# Patient Record
Sex: Female | Born: 1985 | Race: Black or African American | Hispanic: No | Marital: Single | Smoking: Never smoker
Health system: Southern US, Community
[De-identification: ages and names within clinical notes are randomized; demographics above are authoritative.]

## PROBLEM LIST (undated history)

## (undated) DIAGNOSIS — R569 Unspecified convulsions: Secondary | ICD-10-CM

---

## 2010-01-10 ENCOUNTER — Emergency Department (HOSPITAL_BASED_OUTPATIENT_CLINIC_OR_DEPARTMENT_OTHER)
Admission: EM | Admit: 2010-01-10 | Discharge: 2010-01-10 | Payer: Self-pay | Source: Home / Self Care | Admitting: Emergency Medicine

## 2010-01-12 ENCOUNTER — Inpatient Hospital Stay (HOSPITAL_COMMUNITY)
Admission: AD | Admit: 2010-01-12 | Discharge: 2010-01-12 | Payer: Self-pay | Source: Home / Self Care | Attending: Obstetrics & Gynecology | Admitting: Obstetrics & Gynecology

## 2010-01-31 ENCOUNTER — Other Ambulatory Visit: Payer: Self-pay | Admitting: Obstetrics & Gynecology

## 2010-01-31 DIAGNOSIS — N83209 Unspecified ovarian cyst, unspecified side: Secondary | ICD-10-CM

## 2010-03-11 ENCOUNTER — Ambulatory Visit (HOSPITAL_COMMUNITY)
Admission: RE | Admit: 2010-03-11 | Discharge: 2010-03-11 | Disposition: A | Discharge status: Home / Self Care | Payer: 59 | Source: Ambulatory Visit | Attending: Obstetrics & Gynecology | Admitting: Obstetrics & Gynecology

## 2010-03-11 DIAGNOSIS — N83209 Unspecified ovarian cyst, unspecified side: Secondary | ICD-10-CM | POA: Insufficient documentation

## 2010-03-25 LAB — BASIC METABOLIC PANEL
BUN: 22 mg/dL (ref 6–23)
CO2: 26 mEq/L (ref 19–32)
Calcium: 9.4 mg/dL (ref 8.4–10.5)
Chloride: 103 mEq/L (ref 96–112)
Creatinine, Ser: 0.8 mg/dL (ref 0.4–1.2)
GFR calc Af Amer: >60 mL/min (ref >60)
GFR calc non Af Amer: >60 mL/min (ref >60)
Glucose, Bld: 113 mg/dL — ABNORMAL HIGH (ref 70–99)
Potassium: 3.6 mEq/L (ref 3.5–5.1)
Sodium: 143 mEq/L (ref 135–145)

## 2010-03-25 LAB — PREGNANCY, URINE: Preg Test, Ur: NEGATIVE

## 2010-03-25 LAB — URINALYSIS, ROUTINE W REFLEX MICROSCOPIC
Bilirubin Urine: NEGATIVE
Glucose, UA: NEGATIVE mg/dL
Hgb urine dipstick: NEGATIVE
Ketones, ur: 15 mg/dL — AB
Nitrite: NEGATIVE
Protein, ur: NEGATIVE mg/dL
Specific Gravity, Urine: 1.034 — ABNORMAL HIGH (ref 1.005–1.030)
Urobilinogen, UA: 1 mg/dL (ref 0.0–1.0)
pH: 6 (ref 5.0–8.0)

## 2010-03-25 LAB — CBC
HCT: 31.4 % — ABNORMAL LOW (ref 36.0–46.0)
Hemoglobin: 11.4 g/dL — ABNORMAL LOW (ref 12.0–15.0)
MCH: 28.8 pg (ref 26.0–34.0)
MCHC: 36.3 g/dL — ABNORMAL HIGH (ref 30.0–36.0)
MCV: 79.3 fL (ref 78.0–100.0)
Platelets: 235 10*3/uL (ref 150–400)
RBC: 3.96 MIL/uL (ref 3.87–5.11)
RDW: 12.1 % (ref 11.5–15.5)
WBC: 12.4 10*3/uL — ABNORMAL HIGH (ref 4.0–10.5)

## 2010-03-25 LAB — DIFFERENTIAL
Basophils Absolute: 0 10*3/uL (ref 0.0–0.1)
Basophils Relative: 0 % (ref 0–1)
Eosinophils Absolute: 0.6 10*3/uL (ref 0.0–0.7)
Eosinophils Relative: 5 % (ref 0–5)
Lymphocytes Relative: 23 % (ref 12–46)
Lymphs Abs: 2.8 10*3/uL (ref 0.7–4.0)
Monocytes Absolute: 1.4 10*3/uL — ABNORMAL HIGH (ref 0.1–1.0)
Monocytes Relative: 12 % (ref 3–12)
Neutro Abs: 7.5 10*3/uL (ref 1.7–7.7)
Neutrophils Relative %: 61 % (ref 43–77)

## 2010-03-25 LAB — GC/CHLAMYDIA PROBE AMP, GENITAL
Chlamydia, DNA Probe: POSITIVE — AB
GC Probe Amp, Genital: NEGATIVE

## 2010-03-25 LAB — WET PREP, GENITAL
Trich, Wet Prep: NONE SEEN
Yeast Wet Prep HPF POC: NONE SEEN

## 2010-04-05 ENCOUNTER — Inpatient Hospital Stay (HOSPITAL_COMMUNITY)
Admission: AD | Admit: 2010-04-05 | Discharge: 2010-04-05 | Disposition: A | Discharge status: Home / Self Care | Payer: 59 | Source: Ambulatory Visit | Attending: Obstetrics & Gynecology | Admitting: Obstetrics & Gynecology

## 2010-04-05 DIAGNOSIS — N938 Other specified abnormal uterine and vaginal bleeding: Secondary | ICD-10-CM

## 2010-04-05 DIAGNOSIS — N949 Unspecified condition associated with female genital organs and menstrual cycle: Secondary | ICD-10-CM | POA: Insufficient documentation

## 2010-04-05 DIAGNOSIS — N925 Other specified irregular menstruation: Secondary | ICD-10-CM

## 2010-04-05 LAB — URINALYSIS, ROUTINE W REFLEX MICROSCOPIC
Bilirubin Urine: NEGATIVE
Glucose, UA: NEGATIVE mg/dL
Ketones, ur: 15 mg/dL — AB
Leukocytes, UA: NEGATIVE
Nitrite: NEGATIVE
Protein, ur: NEGATIVE mg/dL
Specific Gravity, Urine: 1.025 (ref 1.005–1.030)
Urobilinogen, UA: 0.2 mg/dL (ref 0.0–1.0)
pH: 6.5 (ref 5.0–8.0)

## 2010-04-05 LAB — URINE MICROSCOPIC-ADD ON

## 2010-04-05 LAB — WET PREP, GENITAL
Clue Cells Wet Prep HPF POC: NONE SEEN
Trich, Wet Prep: NONE SEEN
Yeast Wet Prep HPF POC: NONE SEEN

## 2010-04-05 LAB — CBC
HCT: 34.8 % — ABNORMAL LOW (ref 36.0–46.0)
Hemoglobin: 12.4 g/dL (ref 12.0–15.0)
MCH: 28.8 pg (ref 26.0–34.0)
MCHC: 35.6 g/dL (ref 30.0–36.0)
MCV: 80.9 fL (ref 78.0–100.0)
Platelets: 221 10*3/uL (ref 150–400)
RBC: 4.3 MIL/uL (ref 3.87–5.11)
RDW: 14.3 % (ref 11.5–15.5)
WBC: 4.9 10*3/uL (ref 4.0–10.5)

## 2010-04-05 LAB — POCT PREGNANCY, URINE: Preg Test, Ur: NEGATIVE

## 2010-04-06 LAB — URINE CULTURE
Colony Count: 70000
Culture  Setup Time: 201203231603

## 2010-04-08 LAB — GC/CHLAMYDIA PROBE AMP, GENITAL
Chlamydia, DNA Probe: POSITIVE — AB
GC Probe Amp, Genital: NEGATIVE

## 2010-04-17 ENCOUNTER — Encounter: Payer: 59 | Admitting: Obstetrics and Gynecology

## 2011-11-06 IMAGING — US US PELVIS COMPLETE MODIFY
1 series · 13 of 25 positions shown · non-contrast
Comparison: None.

CLINICAL DATA: Abnormal vaginal bleeding.



[Series 1: us pelvis complete modify · 13 of 54 slices shown]
[im 1/54]
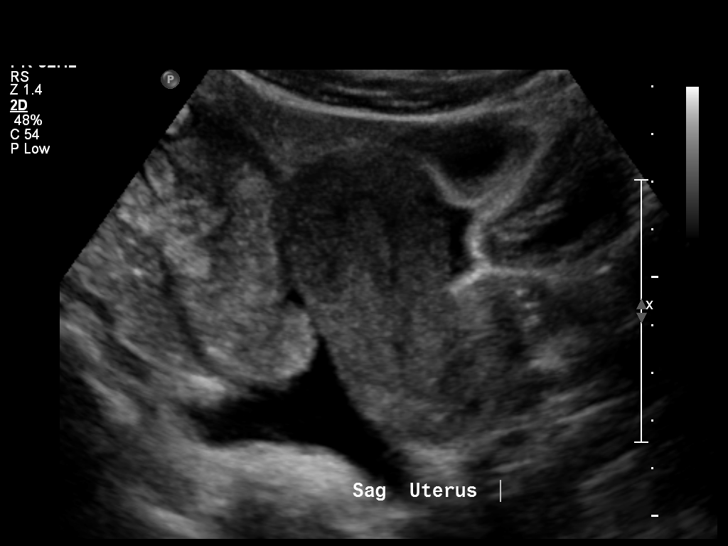
[im 5/54]
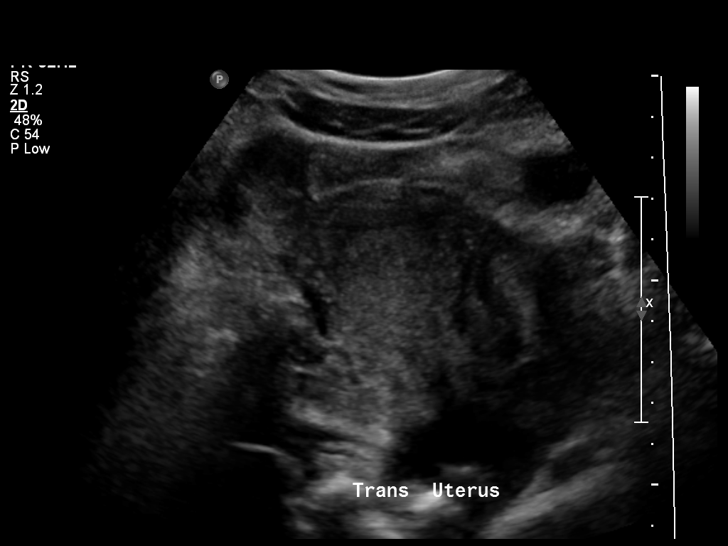
[im 9/54]
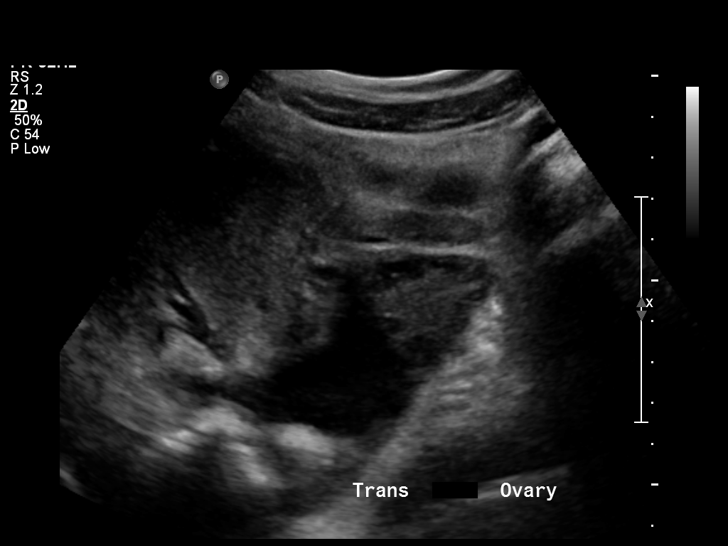
[im 14/54]
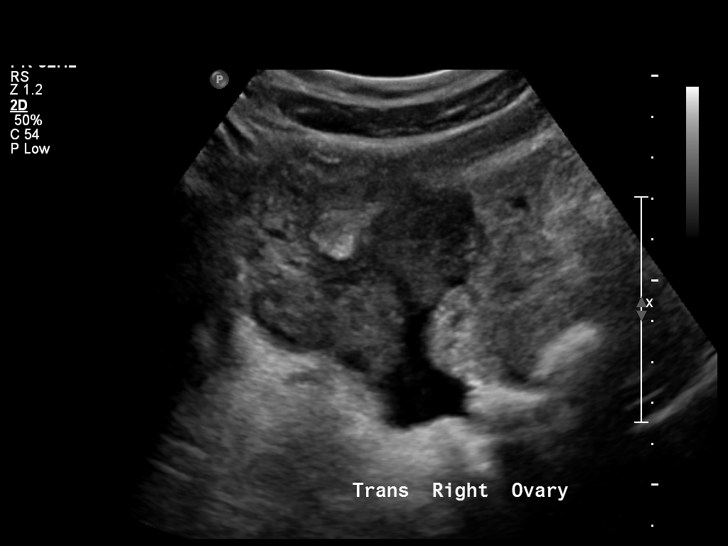
[im 18/54]
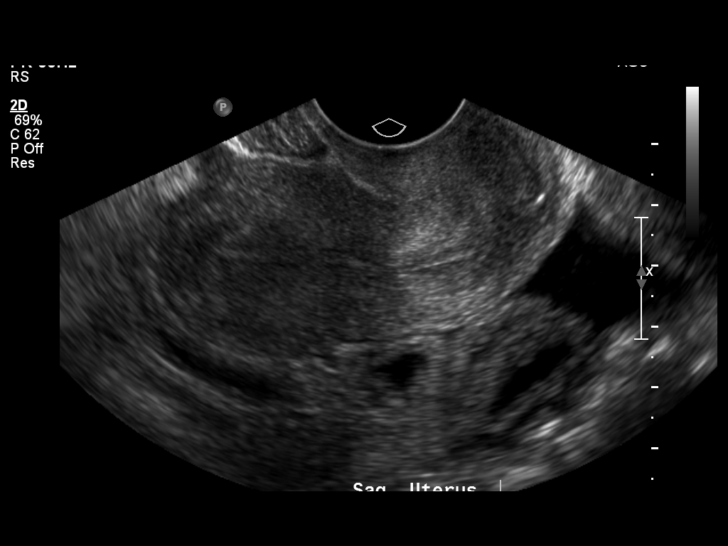
[im 23/54]
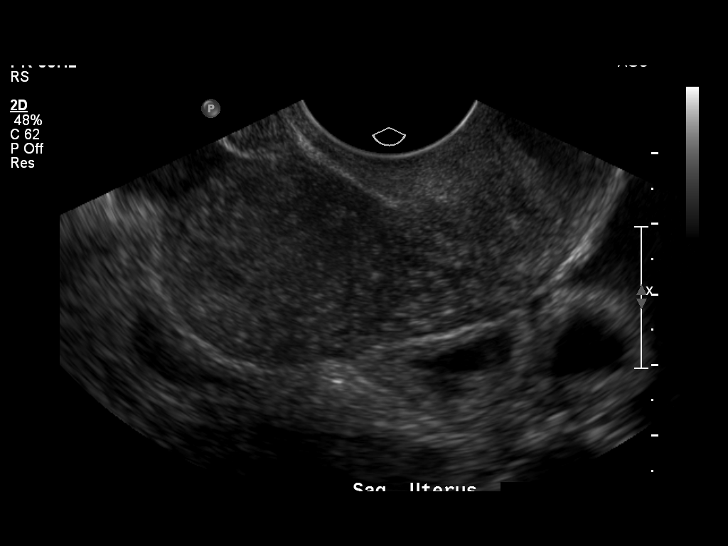
[im 27/54]
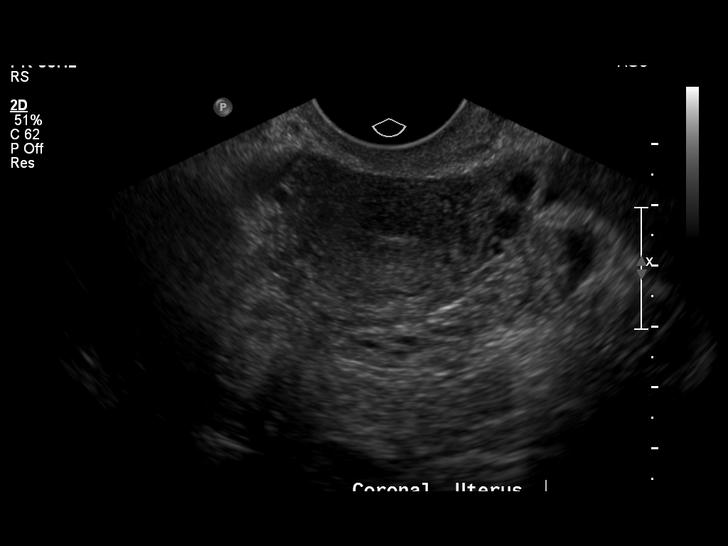
[im 31/54]
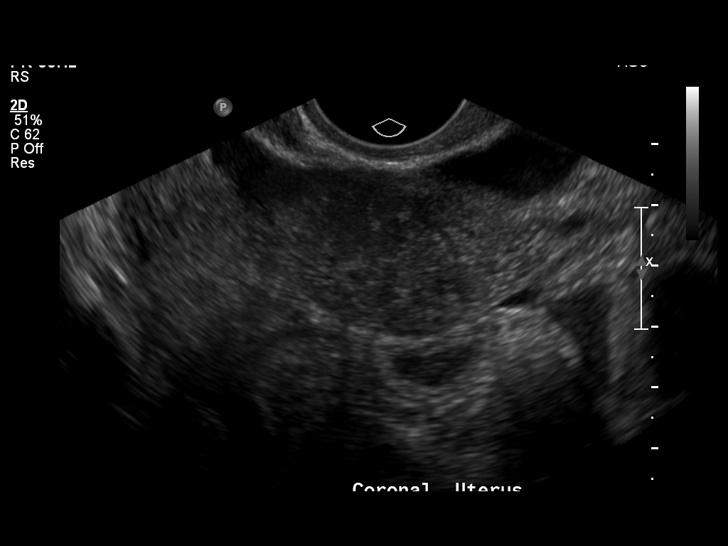
[im 36/54]
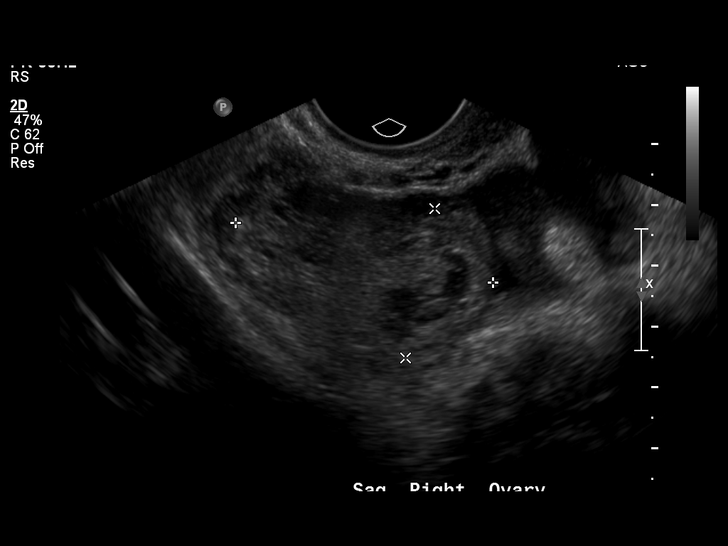
[im 40/54]
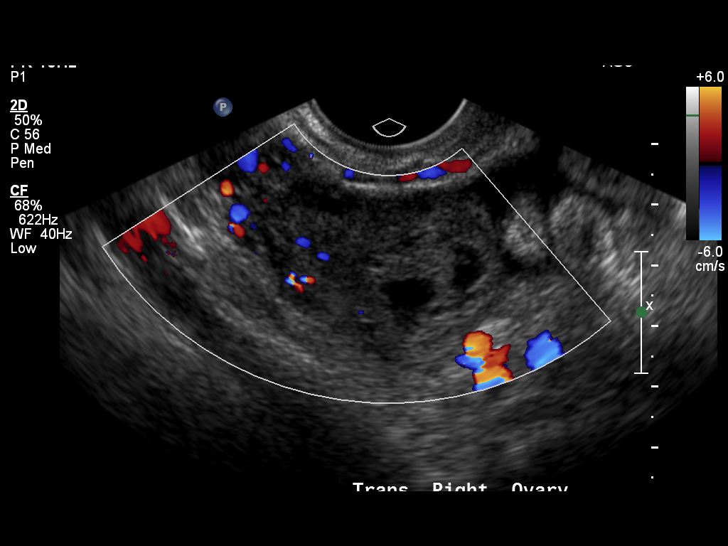
[im 45/54]
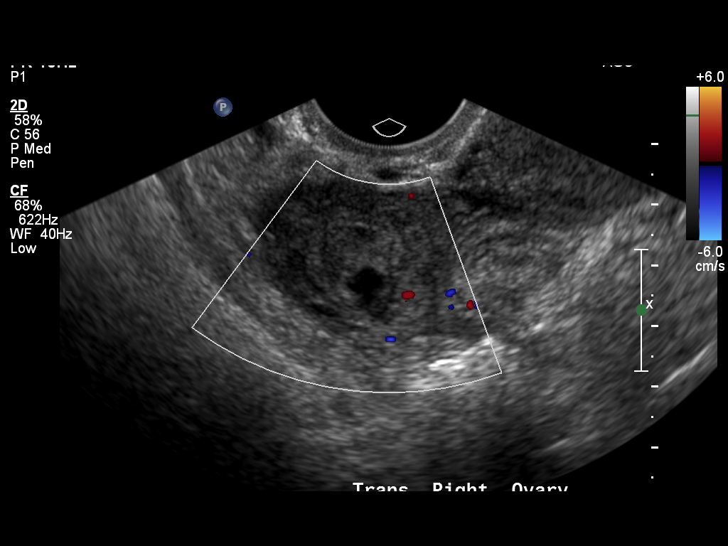
[im 49/54]
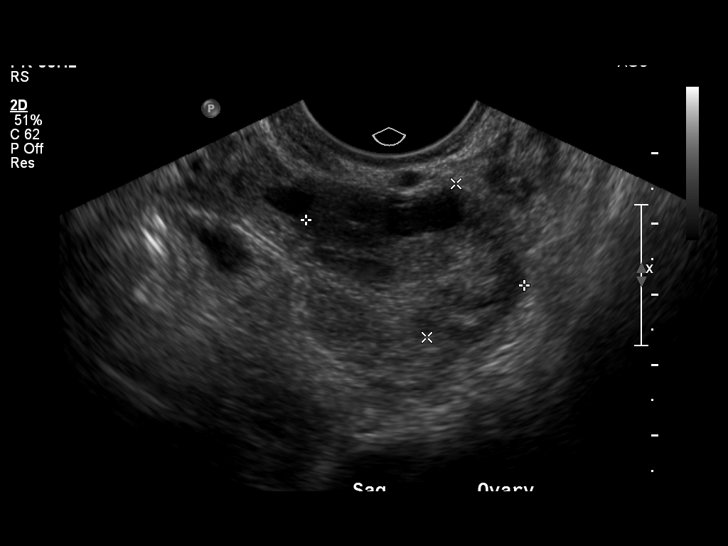
[im 54/54]
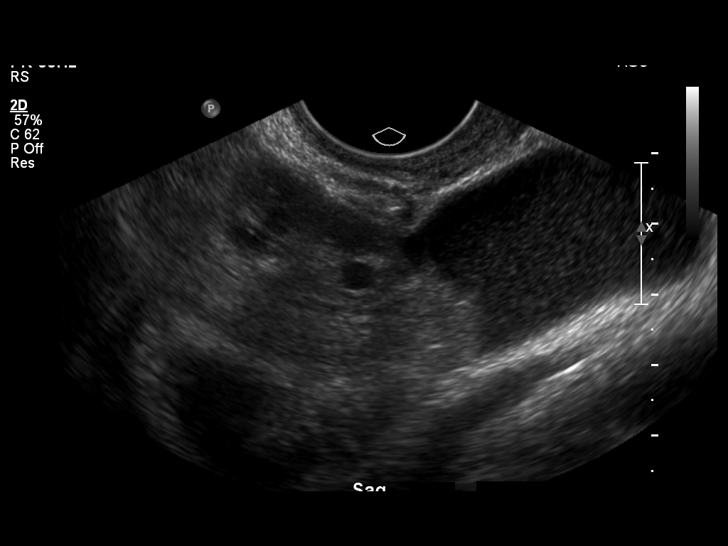

[13 of 25 positions shown; findings below may reference images not displayed]

FINDINGS: Uterus measures 7.4 x 3.2 x 4.3 cm and appears normal.

Endometrium measures 2 mm in thickness and appears normal.

Right Ovary measures 4.3 x 2.5 x 3.2 cm and contains a complex
cystic lesion measuring 2.5 x 2.0 x 2.1 cm.

Left Ovary measures 3.2 x 2.2 x 2.5 cm appears normal.

Other Findings:  There is a small but abnormal amount of complex
free pelvic fluid.
IMPRESSION: 1.  Complex cystic lesion of the right ovary is present along with
a small but to abnormal amount of complex ascites.  This could
represent a ruptured hemorrhagic ovarian cyst.  Tubo-ovarian
abscess is possible but seems unlikely  given the described patient
symptoms and lack of corroborative findings in the uterus.
Correlation with pregnancy test is recommended to exclude ectopic
pregnancy.  I do recommend follow-up sonography in six or 10 weeks
time in order to reassess the right ovary and ensure that this
complex cystic lesion does not represent a neoplasm.

## 2012-01-03 IMAGING — US US TRANSVAGINAL NON-OB
1 series · 14 of 25 positions shown · non-contrast
Comparison: 01/12/2010

CLINICAL DATA: Reevaluate right ovarian cyst.  LMP 02/17/2010

TRANSVAGINAL ULTRASOUND OF PELVIS
TECHNIQUE: Transvaginal ultrasound examination of the pelvis was
performed including evaluation of the uterus, ovaries, adnexal
regions, and pelvic cul-de-sac.

[Series 1: us transvaginal non-ob · 35 acquisitions, 14 frames shown]
[im 1/35]
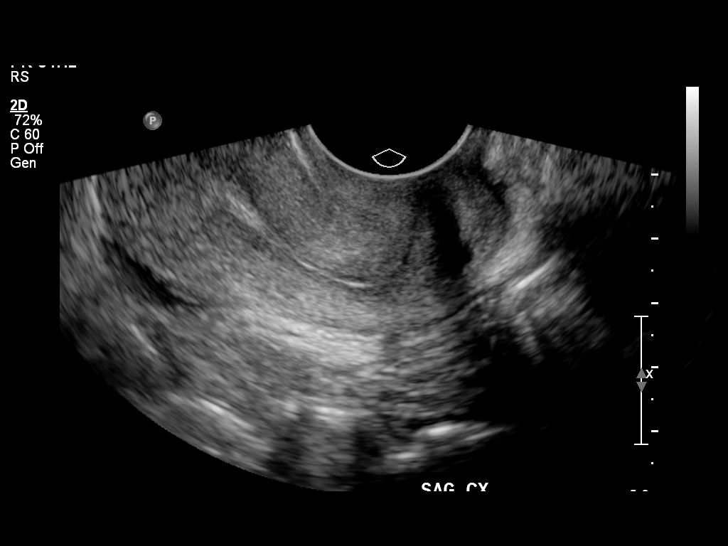
[im 3/35]
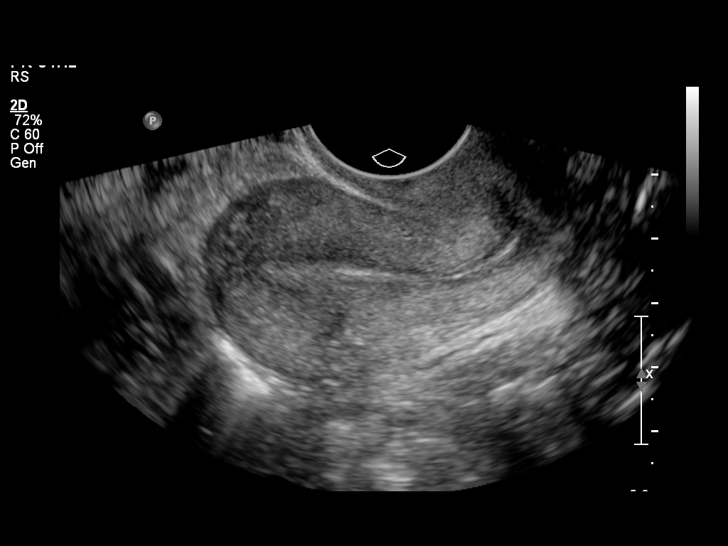
[im 6/35]
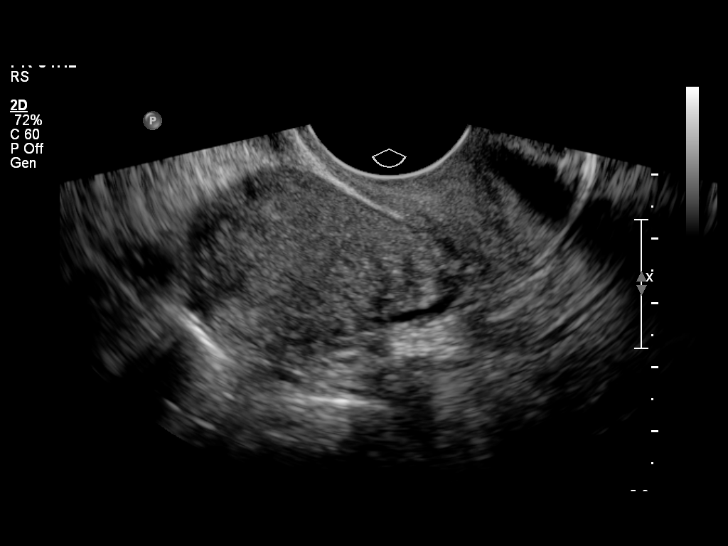
[im 9/35]
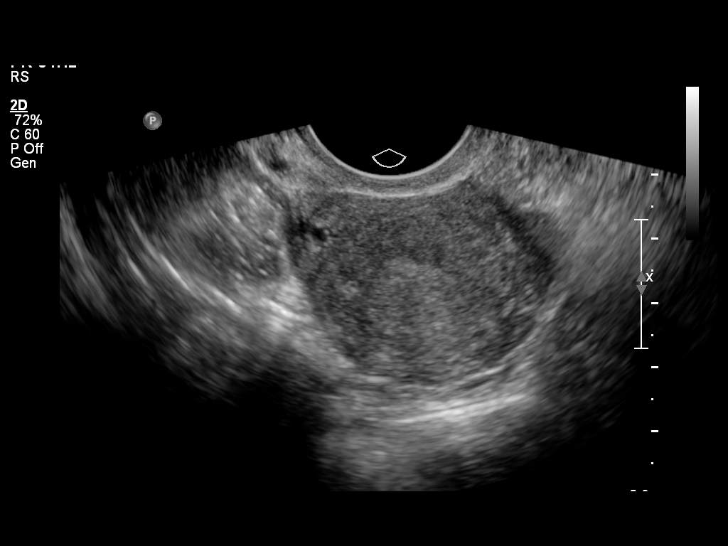
[im 12/35]
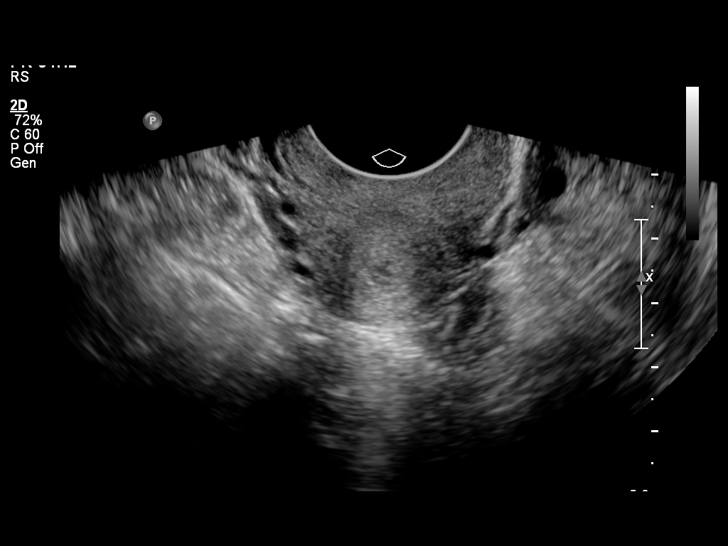
[im 13/35]
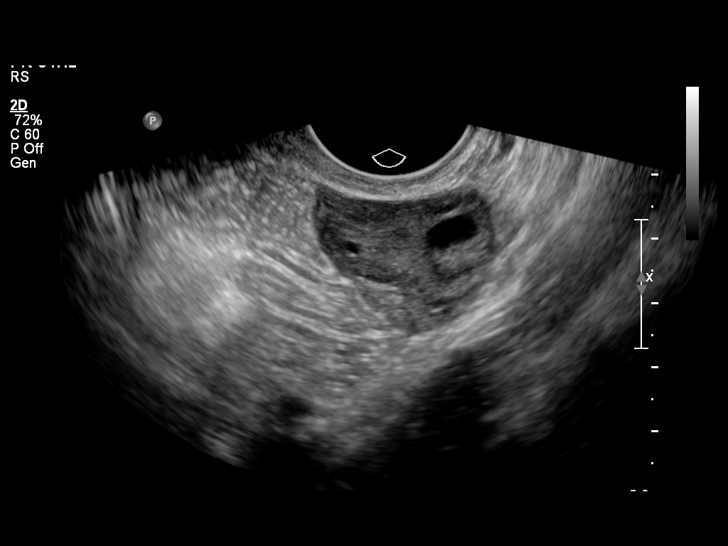
[im 16/35]
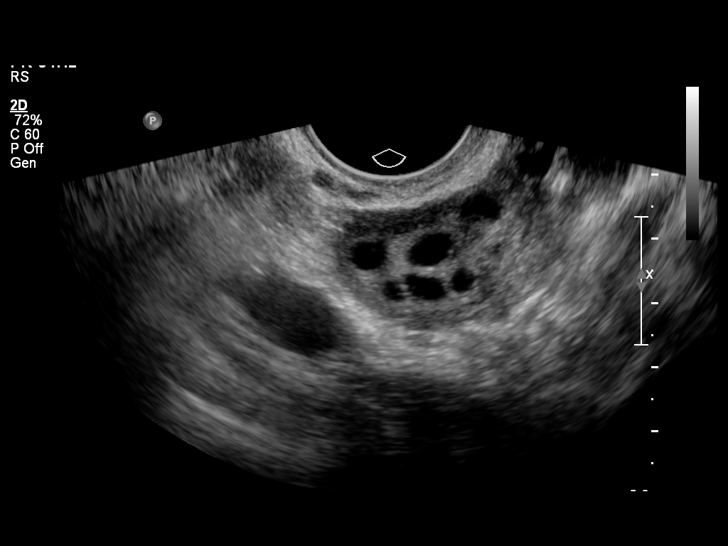
[im 19/35]
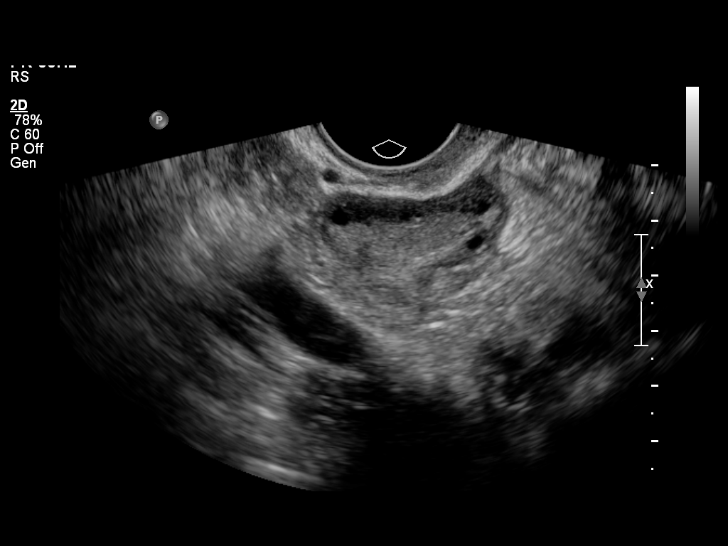
[im 22/35]
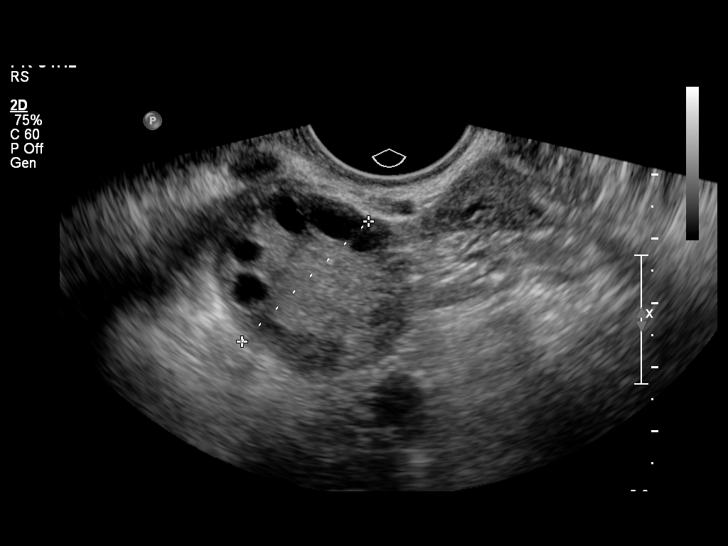
[im 23/35]
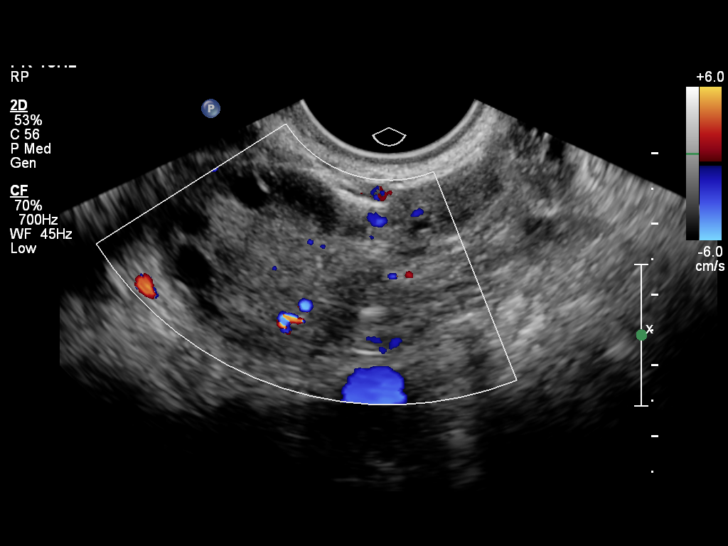
[im 26/35]
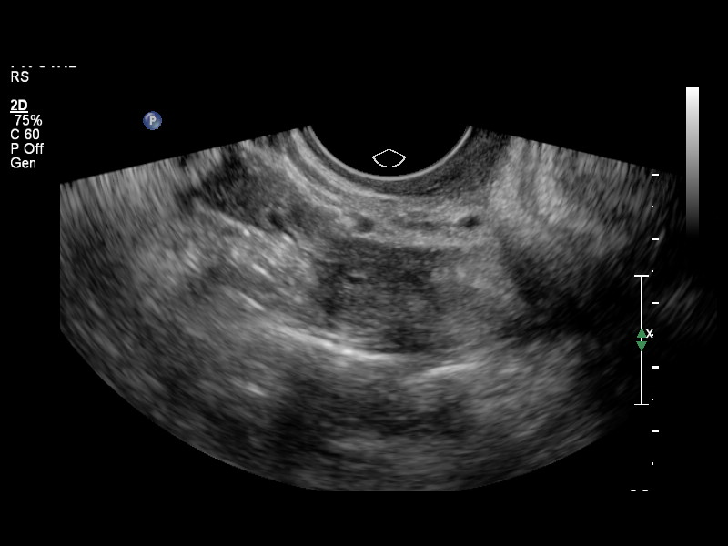
[im 29/35]
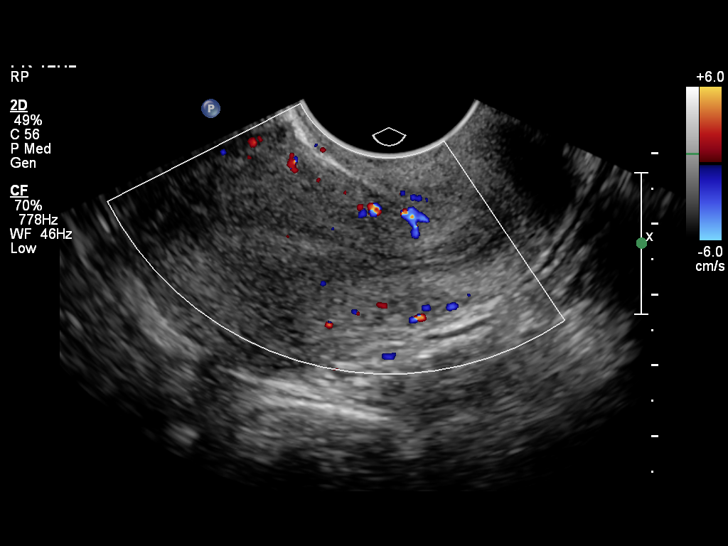
[im 32/35]
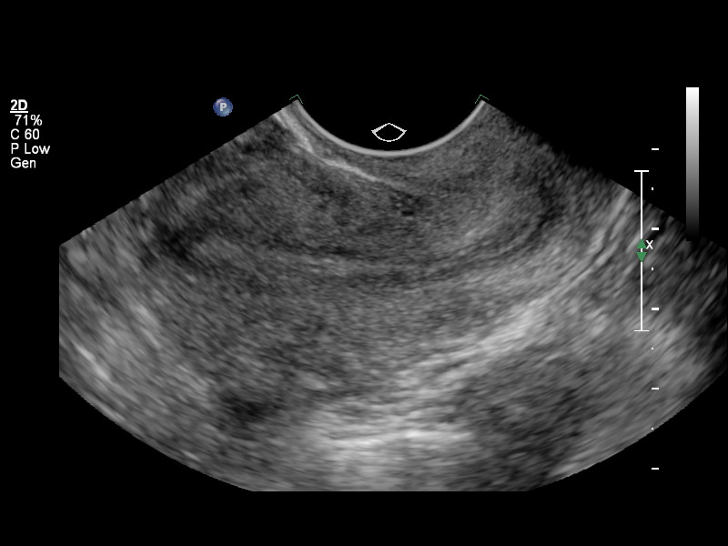
[im 35/35]
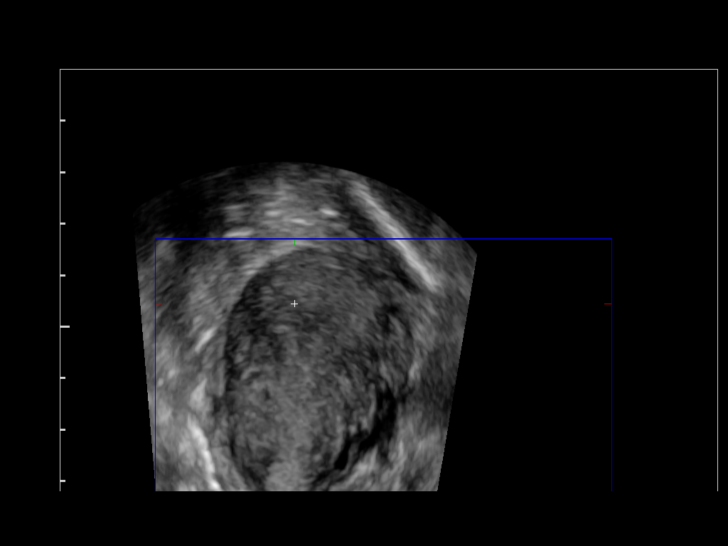

[14 of 25 positions shown; findings below may reference images not displayed]

FINDINGS: Uterus demonstrates a sagittal length of 6.1 cm, an AP depth of
cm and a transverse width of 3.9 cm.  A homogeneous uterine
myometrium is seen.

Endometrium is thin and echogenic with an AP width of 4.1 mm.  No
areas of focal thickening or heterogeneity are seen.

Right Ovary has a normal appearance measuring 3.0 x 2.8 x 2.7 cm.
The previously noted complex area has resolved compatible with
interval resolution of a functional process

Left Ovary has a normal appearance measuring 3.3 x 1.8 x 2.7 cm.

Other Findings:  A trace amount of simple free fluid is noted in
the left adnexa.
IMPRESSION: Normal pelvic ultrasound.  Interval resolution of the complex right
ovarian cyst and pelvic fluid has occurred since the prior exam.

## 2013-01-25 DIAGNOSIS — G40309 Generalized idiopathic epilepsy and epileptic syndromes, not intractable, without status epilepticus: Secondary | ICD-10-CM | POA: Insufficient documentation

## 2013-02-01 ENCOUNTER — Other Ambulatory Visit: Payer: Self-pay | Admitting: Obstetrics

## 2014-04-22 ENCOUNTER — Emergency Department (HOSPITAL_BASED_OUTPATIENT_CLINIC_OR_DEPARTMENT_OTHER)
Admission: EM | Admit: 2014-04-22 | Discharge: 2014-04-22 | Disposition: A | Discharge status: Home / Self Care | Payer: 59 | Attending: Emergency Medicine | Admitting: Emergency Medicine

## 2014-04-22 ENCOUNTER — Encounter (HOSPITAL_BASED_OUTPATIENT_CLINIC_OR_DEPARTMENT_OTHER): Payer: Self-pay

## 2014-04-22 DIAGNOSIS — R569 Unspecified convulsions: Secondary | ICD-10-CM | POA: Insufficient documentation

## 2014-04-22 DIAGNOSIS — Z79899 Other long term (current) drug therapy: Secondary | ICD-10-CM | POA: Insufficient documentation

## 2014-04-22 HISTORY — DX: Unspecified convulsions: R56.9

## 2014-04-22 MED ORDER — DIVALPROEX SODIUM 250 MG PO DR TAB: 500.0000 mg | DELAYED_RELEASE_TABLET | Freq: Once | ORAL | Status: DC

## 2014-04-22 MED ORDER — SODIUM CHLORIDE 0.9 % IV SOLN: 1000.0000 mg | Freq: Once | INTRAVENOUS | Status: AC

## 2014-04-22 MED ORDER — DIVALPROEX SODIUM 250 MG PO DR TAB
500.0000 mg | DELAYED_RELEASE_TABLET | Freq: Once | ORAL | Status: AC
  Administered 2014-04-22: 500 mg via ORAL
  Filled 2014-04-22: qty 2

## 2014-04-22 MED ORDER — LEVETIRACETAM 500 MG/5ML IV SOLN
INTRAVENOUS | Status: AC
  Administered 2014-04-22: 1000 mg via INTRAVENOUS
  Filled 2014-04-22: qty 10

## 2014-04-22 MED ORDER — DIVALPROEX SODIUM ER 500 MG PO TB24: 1000.0000 mg | ORAL_TABLET | Freq: Every day | ORAL | Status: DC

## 2014-04-22 NOTE — ED Notes (Signed)
Patient alert  & oreinted X4, ambulates w/o difficulty

## 2014-04-22 NOTE — ED Provider Notes (Signed)
CSN: [id number]     Arrival date & time 04/22/14  1023 History   First MD Initiated Contact with Patient 04/22/14 1028     Chief Complaint  Patient presents with  . Seizures     (Consider location/radiation/quality/duration/timing/severity/associated sxs/prior Treatment) HPI Comments: Patient presents with seizures. 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 They started as absence seizures that progressed to occasional tonic-clonic seizures. She was previously on Depakote ER 1000 mg daily. She was seeing [facility]. Anne HahnWillis, [facility]. She states that she hasn't had a seizure and about 4-5 years and stopped taking her Depakote about a year ago. Her neurologist was contemplating taking her off the Depakote anyway when she stopped taking her Depakote. She was at work today sitting in her desk chair and had a witnessed generalized tonic-clonic seizure. She was assisted to the floor and did not hit her head on the floor. The seizure lasted about 30 seconds to a minute. She had a confused postictal state for about 15-20 minutes afterward. She has a slight injury to her tongue but denies any other injuries. Following the seizure she complains of a slight headache and dizziness.   Past Medical History  Diagnosis Date  . Seizures    History reviewed. No pertinent past surgical history. No family history on file. History  Substance Use Topics  . Smoking status: Never Smoker   . Smokeless tobacco: Not on file  . Alcohol Use: Not on file   OB History    No data available     Review of Systems  Constitutional: Negative for fever, chills, diaphoresis and fatigue.  HENT: Negative for congestion, rhinorrhea and sneezing.   Eyes: Negative.   Respiratory: Negative for cough, chest tightness and shortness of breath.   Cardiovascular: Negative for chest pain and leg swelling.  Gastrointestinal: Negative for nausea, vomiting, abdominal pain, diarrhea and blood  in stool.  Genitourinary: Negative for frequency, hematuria, flank pain and difficulty urinating.  Musculoskeletal: Negative for back pain and arthralgias.  Skin: Negative for rash.  Neurological: Positive for seizures. Negative for dizziness, speech difficulty, weakness, numbness and headaches.      Allergies  Review of patient's allergies indicates no known allergies.  Home Medications   Prior to Admission medications   Medication Sig Start Date End Date Taking? Authorizing Provider  divalproex (DEPAKOTE) 500 MG DR tablet Take 500 mg by mouth every morning.   Yes Historical Provider, MD  ALTAVERA 0.15-30 MG-MCG tablet TAKE 1 TABLET BY MOUTH EVERY DAY 02/01/13   Brock Badharles A Harper, MD   BP 132/80 mmHg  Pulse 101  Temp(Src) 98.2 F (36.8 C) (Oral)  Resp 16  SpO2 100% Physical Exam  Constitutional: She is oriented to person, place, and time. She appears well-developed and well-nourished.  HENT:  Head: Normocephalic and atraumatic.  Small abrasion to the right side of the tongue.  Eyes: Pupils are equal, round, and reactive to light.  Neck: Normal range of motion. Neck supple.  Cardiovascular: Normal rate, regular rhythm and normal heart sounds.   Pulmonary/Chest: Effort normal and breath sounds normal. No respiratory distress. She has no wheezes. She has no rales. She exhibits no tenderness.  Abdominal: Soft. Bowel sounds are normal. There is no tenderness. There is no rebound and no guarding.  Musculoskeletal: Normal range of motion. She exhibits no edema.  Lymphadenopathy:    She has no cervical adenopathy.  Neurological: She is alert and oriented to person,  place, and time. She has normal strength. No cranial nerve deficit or sensory deficit. GCS eye subscore is 4. GCS verbal subscore is 5. GCS motor subscore is 6.  Skin: Skin is warm and dry. No rash noted.  Psychiatric: She has a normal mood and affect.    ED Course  Procedures (including critical care time) Labs  Review Labs Reviewed - No data to display  Imaging Review No results found.   EKG Interpretation None      MDM   Final diagnoses:  None    Following the seizure, the patient was loaded with Keppra IV. Also prior to discharge she was given a oral dose of Depakote. I will give her prescription for her normal dose of Depakote which is at thousand milligrams once a day to start in the morning. She will follow-up with her neurologist in [facility]. She had no further seizure activity in the ED and was monitored for about 2 hours. The seizure seems typical for her past seizures. She hasn't had any recent fevers or other illnesses. She is back to baseline now was discharged home with her mother.    Rolan Bucco, MD 04/22/14 1235

## 2014-04-22 NOTE — Discharge Instructions (Signed)

## 2014-04-22 NOTE — ED Notes (Signed)
  CBG 103  

## 2014-04-22 NOTE — ED Notes (Signed)
Patient sitting at desk and witnessed tonic/clonic seizure activity. Patient lowered to floor and seizure lasted approximately 60-90 seconds. No incontinence of urine and bleeding noted from right side of tongue. Has history of seizures and has not had one for years per mother. Stopped taking her depakote over a year ago.

## 2014-12-20 ENCOUNTER — Encounter: Payer: Self-pay | Admitting: Obstetrics

## 2014-12-20 ENCOUNTER — Ambulatory Visit (INDEPENDENT_AMBULATORY_CARE_PROVIDER_SITE_OTHER): Payer: 59 | Admitting: Obstetrics

## 2014-12-20 VITALS — BP 143/89 | HR 83 | Temp 98.3°F | Ht 66.0 in | Wt 161.0 lb

## 2014-12-20 DIAGNOSIS — Z30011 Encounter for initial prescription of contraceptive pills: Secondary | ICD-10-CM

## 2014-12-20 DIAGNOSIS — Z01419 Encounter for gynecological examination (general) (routine) without abnormal findings: Secondary | ICD-10-CM | POA: Diagnosis not present

## 2014-12-20 MED ORDER — LEVONORGESTREL-ETHINYL ESTRAD 0.15-30 MG-MCG PO TABS: 1.0000 | ORAL_TABLET | Freq: Every day | ORAL | Status: DC

## 2014-12-20 NOTE — Progress Notes (Signed)
Subjective:        Frances Cole is a 29 y.o. female here for a routine exam.  Current complaints: Facial acne when off OCP's.    Personal health questionnaire:  Is patient Ashkenazi Jewish, have a family history of breast and/or ovarian cancer: no Is there a family history of uterine cancer diagnosed at age < 5850, gastrointestinal cancer, urinary tract cancer, family member who is a Personnel officerLynch syndrome-associated carrier: no Is the patient overweight and hypertensive, family history of diabetes, personal history of gestational diabetes, preeclampsia or PCOS: no Is patient over 2055, have PCOS,  family history of premature CHD under age 28, diabetes, smoke, have hypertension or peripheral artery disease:  no At any time, has a partner hit, kicked or otherwise hurt or frightened you?: no Over the past 2 weeks, have you felt down, depressed or hopeless?: no Over the past 2 weeks, have you felt little interest or pleasure in doing things?:no   Gynecologic History Patient's last menstrual period was 11/18/2014 (approximate). Contraception: condoms Last Pap: several yrs.. Results were: normal Last mammogram: n/a. Results were: n/a  Obstetric History OB History  No data available    Past Medical History  Diagnosis Date  . Seizures (HCC)     History reviewed. No pertinent past surgical history.   Current outpatient prescriptions:  .  divalproex (DEPAKOTE ER) 500 MG 24 hr tablet, Take 2 tablets (1,000 mg total) by mouth daily., Disp: 60 tablet, Rfl: 0 .  levonorgestrel-ethinyl estradiol (ALTAVERA) 0.15-30 MG-MCG tablet, Take 1 tablet by mouth daily., Disp: 28 tablet, Rfl: 10 No Known Allergies  Social History  Substance Use Topics  . Smoking status: Never Smoker   . Smokeless tobacco: Not on file  . Alcohol Use: 0.0 oz/week    0 Standard drinks or equivalent per week     Comment: Occassionally    History reviewed. No pertinent family history.    Review of  Systems  Constitutional: negative for fatigue and weight loss Respiratory: negative for cough and wheezing Cardiovascular: negative for chest pain, fatigue and palpitations Gastrointestinal: negative for abdominal pain and change in bowel habits Musculoskeletal:negative for myalgias Neurological: negative for gait problems and tremors Behavioral/Psych: negative for abusive relationship, depression Endocrine: negative for temperature intolerance   Genitourinary:negative for abnormal menstrual periods, genital lesions, hot flashes, sexual problems and vaginal discharge Integument/breast: negative for breast lump, breast tenderness, nipple discharge and skin lesion(s)    Objective:       BP 143/89 mmHg  Pulse 83  Temp(Src) 98.3 F (36.8 C)  Ht 5\' 6"  (1.676 m)  Wt 161 lb (73.029 kg)  BMI 26.00 kg/m2  LMP 11/18/2014 (Approximate) General:   alert  Skin:   no rash or abnormalities  Lungs:   clear to auscultation bilaterally  Heart:   regular rate and rhythm, S1, S2 normal, no murmur, click, rub or gallop  Breasts:   normal without suspicious masses, skin or nipple changes or axillary nodes  Abdomen:  normal findings: no organomegaly, soft, non-tender and no hernia  Pelvis:  External genitalia: normal general appearance Urinary system: urethral meatus normal and bladder without fullness, nontender Vaginal: normal without tenderness, induration or masses Cervix: normal appearance Adnexa: normal bimanual exam Uterus: anteverted and non-tender, normal size   Lab Review Urine pregnancy test Labs reviewed yes Radiologic studies reviewed no    Assessment:    Healthy female exam.    Plan:    Education reviewed: calcium supplements, low fat, low cholesterol diet,  safe sex/STD prevention, self breast exams and weight bearing exercise. Contraception: OCP (estrogen/progesterone). Follow up in: 1 year.   Meds ordered this encounter  Medications  . levonorgestrel-ethinyl  estradiol (ALTAVERA) 0.15-30 MG-MCG tablet    Sig: Take 1 tablet by mouth daily.    Dispense:  28 tablet    Refill:  10   No orders of the defined types were placed in this encounter.

## 2014-12-20 NOTE — Addendum Note (Signed)
Addended by: Marya LandryFOSTER, Mariellen Blaney D on: 12/20/2014 03:24 PM   Modules accepted: Orders

## 2014-12-20 NOTE — Patient Instructions (Signed)

## 2014-12-21 LAB — PAP IG W/ RFLX HPV ASCU

## 2014-12-24 LAB — SURESWAB, VAGINOSIS/VAGINITIS PLUS
Atopobium vaginae: 7.2 Log (cells/mL)
C. albicans, DNA: NOT DETECTED
C. glabrata, DNA: NOT DETECTED
C. parapsilosis, DNA: NOT DETECTED
C. trachomatis RNA, TMA: NOT DETECTED
C. tropicalis, DNA: NOT DETECTED
Gardnerella vaginalis: >8 Log (cells/mL)
LACTOBACILLUS SPECIES: NOT DETECTED Log (cells/mL)
MEGASPHAERA SPECIES: 7.8 Log (cells/mL)
N. gonorrhoeae RNA, TMA: NOT DETECTED
T. vaginalis RNA, QL TMA: NOT DETECTED

## 2014-12-25 ENCOUNTER — Other Ambulatory Visit: Payer: Self-pay | Admitting: Obstetrics

## 2014-12-25 DIAGNOSIS — N760 Acute vaginitis: Principal | ICD-10-CM

## 2014-12-25 DIAGNOSIS — B9689 Other specified bacterial agents as the cause of diseases classified elsewhere: Secondary | ICD-10-CM

## 2014-12-25 MED ORDER — METRONIDAZOLE 500 MG PO TABS: 500.0000 mg | ORAL_TABLET | Freq: Two times a day (BID) | ORAL | Status: DC

## 2015-06-06 MED FILL — LEVONOR-ETH ESTRAD 0.15-0.0: 0.15-30 | 28 days supply | Qty: 28 | Fill #0

## 2015-06-06 MED FILL — DIVALPROEX SOD ER 500 MG TA: 500 | 30 days supply | Qty: 60 | Fill #0

## 2015-07-05 MED FILL — MARLISSA-28 TABLET: 0.15-30 | 28 days supply | Qty: 28 | Fill #1 | Status: TO

## 2015-07-05 MED FILL — DIVALPROEX SOD ER 500 MG TA: 500 | 30 days supply | Qty: 60 | Fill #1

## 2015-08-06 MED FILL — MARLISSA-28 TABLET: 0.15-30 | 28 days supply | Qty: 28 | Fill #0 | Status: TO

## 2015-08-28 DIAGNOSIS — G40309 Generalized idiopathic epilepsy and epileptic syndromes, not intractable, without status epilepticus: Secondary | ICD-10-CM | POA: Diagnosis not present

## 2015-08-28 MED FILL — DIVALPROEX SOD ER 500 MG TA: 500 | 30 days supply | Qty: 90 | Fill #0

## 2015-08-29 MED FILL — MARLISSA-28 TABLET: 0.15-30 | 28 days supply | Qty: 28 | Fill #0

## 2015-09-14 MED FILL — PREVIDENT 5000 SENSITIVE PA: 1.1-5 | 30 days supply | Qty: 100 | Fill #0

## 2015-10-01 MED FILL — MARLISSA-28 TABLET: 0.15-30 | 28 days supply | Qty: 28 | Fill #1

## 2015-10-11 MED FILL — DIVALPROEX SOD ER 500 MG TA: 500 | 30 days supply | Qty: 90 | Fill #1

## 2015-10-25 ENCOUNTER — Telehealth: Payer: Self-pay | Admitting: Obstetrics

## 2015-10-25 DIAGNOSIS — Z30011 Encounter for initial prescription of contraceptive pills: Secondary | ICD-10-CM

## 2015-10-25 MED ORDER — LEVONORGESTREL-ETHINYL ESTRAD 0.15-30 MG-MCG PO TABS: 1.0000 | ORAL_TABLET | Freq: Every day | ORAL | 3 refills | Status: DC

## 2015-10-25 MED FILL — LEVONOR-ETH ESTRAD 0.15-0.0: 0.15-30 | 84 days supply | Qty: 84 | Fill #0

## 2015-10-25 NOTE — Telephone Encounter (Signed)
[facility] her [facility] is already scheduled for December, but does not have enough refills to get through to December.  3 refills authorized.  Patient notified.

## 2015-12-07 MED FILL — DIVALPROEX SOD ER 500 MG TA: 500 | 30 days supply | Qty: 90 | Fill #2

## 2015-12-25 ENCOUNTER — Encounter: Payer: Self-pay | Admitting: Obstetrics

## 2015-12-25 ENCOUNTER — Ambulatory Visit (INDEPENDENT_AMBULATORY_CARE_PROVIDER_SITE_OTHER): Payer: 59 | Admitting: Obstetrics

## 2015-12-25 DIAGNOSIS — Z0000 Encounter for general adult medical examination without abnormal findings: Secondary | ICD-10-CM

## 2015-12-25 DIAGNOSIS — Z01419 Encounter for gynecological examination (general) (routine) without abnormal findings: Secondary | ICD-10-CM

## 2015-12-25 DIAGNOSIS — Z3041 Encounter for surveillance of contraceptive pills: Secondary | ICD-10-CM

## 2015-12-25 DIAGNOSIS — Z124 Encounter for screening for malignant neoplasm of cervix: Secondary | ICD-10-CM

## 2015-12-25 DIAGNOSIS — Z30011 Encounter for initial prescription of contraceptive pills: Secondary | ICD-10-CM

## 2015-12-25 MED ORDER — LEVONORGESTREL-ETHINYL ESTRAD 0.15-30 MG-MCG PO TABS: 1.0000 | ORAL_TABLET | Freq: Every day | ORAL | 11 refills | Status: DC

## 2015-12-25 NOTE — Progress Notes (Signed)
Subjective:        Frances Cole is a 30 y.o. female here for a routine exam.  Current complaints: None.    Personal health questionnaire:  Is patient Ashkenazi Jewish, have a family history of breast and/or ovarian cancer: no Is there a family history of uterine cancer diagnosed at age < 6150, gastrointestinal cancer, urinary tract cancer, family member who is a Personnel officerLynch syndrome-associated carrier: no Is the patient overweight and hypertensive, family history of diabetes, personal history of gestational diabetes, preeclampsia or PCOS: no Is patient over 7255, have PCOS,  family history of premature CHD under age 29, diabetes, smoke, have hypertension or peripheral artery disease:  no At any time, has a partner hit, kicked or otherwise hurt or frightened you?: no Over the past 2 weeks, have you felt down, depressed or hopeless?: no Over the past 2 weeks, have you felt little interest or pleasure in doing things?:no   Gynecologic History Patient's last menstrual period was 12/19/2015 (approximate). Contraception: OCP (estrogen/progesterone) Last Pap: 2016. Results were: normal Last mammogram: n/a. Results were: n/a  Obstetric History OB History  No data available    Past Medical History:  Diagnosis Date  . Seizures (HCC)     History reviewed. No pertinent surgical history.   Current Outpatient Prescriptions:  .  divalproex (DEPAKOTE ER) 500 MG 24 hr tablet, Take 2 tablets (1,000 mg total) by mouth daily., Disp: 60 tablet, Rfl: 0 .  levonorgestrel-ethinyl estradiol (ALTAVERA) 0.15-30 MG-MCG tablet, Take 1 tablet by mouth daily., Disp: 28 tablet, Rfl: 3 .  metroNIDAZOLE (FLAGYL) 500 MG tablet, Take 1 tablet (500 mg total) by mouth 2 (two) times daily. (Patient not taking: Reported on 12/25/2015), Disp: 14 tablet, Rfl: 2 No Known Allergies  Social History  Substance Use Topics  . Smoking status: Never Smoker  . Smokeless tobacco: Never Used  . Alcohol use 0.0 oz/week   Comment: Occassionally    History reviewed. No pertinent family history.    Review of Systems  Constitutional: negative for fatigue and weight loss Respiratory: negative for cough and wheezing Cardiovascular: negative for chest pain, fatigue and palpitations Gastrointestinal: negative for abdominal pain and change in bowel habits Musculoskeletal:negative for myalgias Neurological: negative for gait problems and tremors Behavioral/Psych: negative for abusive relationship, depression Endocrine: negative for temperature intolerance    Genitourinary:negative for abnormal menstrual periods, genital lesions, hot flashes, sexual problems and vaginal discharge Integument/breast: negative for breast lump, breast tenderness, nipple discharge and skin lesion(s)    Objective:       BP 119/81   Pulse 73   Temp 98.8 F (37.1 C) (Oral)   Wt 177 lb 12.8 oz (80.6 kg)   LMP 12/19/2015 (Approximate)   BMI 28.70 kg/m  General:   alert  Skin:   no rash or abnormalities  Lungs:   clear to auscultation bilaterally  Heart:   regular rate and rhythm, S1, S2 normal, no murmur, click, rub or gallop  Breasts:   normal without suspicious masses, skin or nipple changes or axillary nodes  Abdomen:  normal findings: no organomegaly, soft, non-tender and no hernia  Pelvis:  External genitalia: normal general appearance Urinary system: urethral meatus normal and bladder without fullness, nontender Vaginal: normal without tenderness, induration or masses Cervix: normal appearance Adnexa: normal bimanual exam Uterus: anteverted and non-tender, normal size   Lab Review Urine pregnancy test Labs reviewed yes Radiologic studies reviewed no  50% of 20 min visit spent on counseling and coordination of care.  Assessment:    Healthy female exam.    Contraceptive Surveillance.  Pleased with OCP.   Plan:    Education reviewed: calcium supplements, low fat, low cholesterol diet, safe sex/STD prevention,  self breast exams and weight bearing exercise. Contraception: OCP (estrogen/progesterone). Follow up in: 1 year.   No orders of the defined types were placed in this encounter.  No orders of the defined types were placed in this encounter.    Patient ID: Frances Cole, female   DOB: 1985-06-29, 29 y.o.   MRN: [id number]

## 2015-12-25 NOTE — Addendum Note (Signed)
Addended by: Francene FindersJAMES, QUINETTA C on: 12/25/2015 10:24 AM   Modules accepted: Orders

## 2015-12-27 LAB — CYTOLOGY - PAP: Diagnosis: NEGATIVE

## 2015-12-28 LAB — NUSWAB VG+, CANDIDA 6SP
Candida albicans, NAA: NEGATIVE
Candida glabrata, NAA: NEGATIVE
Candida krusei, NAA: NEGATIVE
Candida lusitaniae, NAA: NEGATIVE
Candida parapsilosis, NAA: NEGATIVE
Candida tropicalis, NAA: NEGATIVE
Chlamydia trachomatis, NAA: NEGATIVE
Neisseria gonorrhoeae, NAA: NEGATIVE
Trich vag by NAA: NEGATIVE

## 2016-01-21 MED FILL — LEVONOR-ETH ESTRAD 0.15-0.0: 0.15-30 | 84 days supply | Qty: 84 | Fill #0

## 2016-01-31 MED FILL — DIVALPROEX SOD ER 500 MG TA: 500 | 30 days supply | Qty: 90 | Fill #3

## 2016-03-28 MED FILL — DIVALPROEX SOD ER 500 MG TA: 500 | 30 days supply | Qty: 90 | Fill #4

## 2016-03-31 DIAGNOSIS — F329 Major depressive disorder, single episode, unspecified: Secondary | ICD-10-CM | POA: Diagnosis not present

## 2016-03-31 DIAGNOSIS — F419 Anxiety disorder, unspecified: Secondary | ICD-10-CM | POA: Diagnosis not present

## 2016-04-01 MED FILL — FLUoxetine HCL 20 MG CAPS: 20 | 30 days supply | Qty: 30 | Fill #0

## 2016-04-11 MED FILL — MARLISSA-28 TABLET: 0.15-30 | 84 days supply | Qty: 84 | Fill #1

## 2016-06-20 MED FILL — DIVALPROEX SOD ER 500 MG TA: 500 | 30 days supply | Qty: 90 | Fill #5

## 2016-07-07 MED FILL — LEVONOR-ETH ESTRAD 0.15-0.0: 0.15-30 | 84 days supply | Qty: 84 | Fill #2

## 2016-09-26 MED FILL — LEVONOR-ETH ESTRAD 0.15-0.0: 0.15-30 | 84 days supply | Qty: 84 | Fill #3

## 2016-09-30 MED FILL — DIVALPROEX SOD ER 500 MG TA: 500 | 30 days supply | Qty: 90 | Fill #0

## 2016-12-24 ENCOUNTER — Other Ambulatory Visit: Payer: Self-pay | Admitting: Obstetrics

## 2016-12-24 DIAGNOSIS — Z30011 Encounter for initial prescription of contraceptive pills: Secondary | ICD-10-CM

## 2016-12-30 ENCOUNTER — Ambulatory Visit (INDEPENDENT_AMBULATORY_CARE_PROVIDER_SITE_OTHER): Payer: 59 | Admitting: Obstetrics

## 2016-12-30 ENCOUNTER — Encounter: Payer: Self-pay | Admitting: Obstetrics

## 2016-12-30 VITALS — Ht 67.0 in | Wt 182.0 lb

## 2016-12-30 DIAGNOSIS — Z1151 Encounter for screening for human papillomavirus (HPV): Secondary | ICD-10-CM | POA: Diagnosis not present

## 2016-12-30 DIAGNOSIS — Z0000 Encounter for general adult medical examination without abnormal findings: Secondary | ICD-10-CM

## 2016-12-30 DIAGNOSIS — Z3041 Encounter for surveillance of contraceptive pills: Secondary | ICD-10-CM

## 2016-12-30 DIAGNOSIS — Z01419 Encounter for gynecological examination (general) (routine) without abnormal findings: Secondary | ICD-10-CM | POA: Diagnosis not present

## 2016-12-30 DIAGNOSIS — Z113 Encounter for screening for infections with a predominantly sexual mode of transmission: Secondary | ICD-10-CM | POA: Diagnosis not present

## 2016-12-30 DIAGNOSIS — Z124 Encounter for screening for malignant neoplasm of cervix: Secondary | ICD-10-CM

## 2016-12-30 MED ORDER — LEVONORGESTREL-ETHINYL ESTRAD 0.15-30 MG-MCG PO TABS: 1.0000 | ORAL_TABLET | Freq: Every day | ORAL | 11 refills | Status: DC

## 2016-12-30 NOTE — Progress Notes (Signed)
Subjective:        Frances Cole is a 31 y.o. female here for a routine exam.  Current complaints: None.    Personal health questionnaire:  Is patient Ashkenazi Jewish, have a family history of breast and/or ovarian cancer: no Is there a family history of uterine cancer diagnosed at age < 5950, gastrointestinal cancer, urinary tract cancer, family member who is a Personnel officerLynch syndrome-associated carrier: no Is the patient overweight and hypertensive, family history of diabetes, personal history of gestational diabetes, preeclampsia or PCOS: no Is patient over 955, have PCOS,  family history of premature CHD under age 30, diabetes, smoke, have hypertension or peripheral artery disease:  no At any time, has a partner hit, kicked or otherwise hurt or frightened you?: no Over the past 2 weeks, have you felt down, depressed or hopeless?: no Over the past 2 weeks, have you felt little interest or pleasure in doing things?:no   Gynecologic History Patient's last menstrual period was 12/18/2016 (approximate). Contraception: OCP (estrogen/progesterone) Last Pap: 2017. Results were: normal Last mammogram: n/a. Results were: n/a  Obstetric History OB History  Gravida Para Term Preterm AB Living  0 0 0 0 0 0  SAB TAB Ectopic Multiple Live Births  0 0 0 0 0        Past Medical History:  Diagnosis Date  . Seizures (HCC)     History reviewed. No pertinent surgical history.   Current Outpatient Medications:  .  divalproex (DEPAKOTE ER) 500 MG 24 hr tablet, Take 2 tablets (1,000 mg total) by mouth daily., Disp: 60 tablet, Rfl: 0 .  levonorgestrel-ethinyl estradiol (NORDETTE) 0.15-30 MG-MCG tablet, Take 1 tablet by mouth daily., Disp: 28 tablet, Rfl: 11 .  metroNIDAZOLE (FLAGYL) 500 MG tablet, Take 1 tablet (500 mg total) by mouth 2 (two) times daily. (Patient not taking: Reported on 12/25/2015), Disp: 14 tablet, Rfl: 2 No Known Allergies  Social History   Tobacco Use  . Smoking status:  Never Smoker  . Smokeless tobacco: Never Used  Substance Use Topics  . Alcohol use: Yes    Alcohol/week: 0.0 oz    Comment: Occassionally    Family History  Problem Relation Age of Onset  . Hypertension Mother   . Lung cancer Maternal Grandfather       Review of Systems  Constitutional: negative for fatigue and weight loss Respiratory: negative for cough and wheezing Cardiovascular: negative for chest pain, fatigue and palpitations Gastrointestinal: negative for abdominal pain and change in bowel habits Musculoskeletal:negative for myalgias Neurological: negative for gait problems and tremors Behavioral/Psych: negative for abusive relationship, depression Endocrine: negative for temperature intolerance    Genitourinary:negative for abnormal menstrual periods, genital lesions, hot flashes, sexual problems and vaginal discharge Integument/breast: negative for breast lump, breast tenderness, nipple discharge and skin lesion(s)    Objective:       Ht 5\' 7"  (1.702 m)   Wt 182 lb (82.6 kg)   LMP 12/18/2016 (Approximate)   BMI 28.51 kg/m  General:   alert  Skin:   no rash or abnormalities  Lungs:   clear to auscultation bilaterally  Heart:   regular rate and rhythm, S1, S2 normal, no murmur, click, rub or gallop  Breasts:   normal without suspicious masses, skin or nipple changes or axillary nodes  Abdomen:  normal findings: no organomegaly, soft, non-tender and no hernia  Pelvis:  External genitalia: normal general appearance Urinary system: urethral meatus normal and bladder without fullness, nontender Vaginal: normal without tenderness,  induration or masses Cervix: normal appearance Adnexa: normal bimanual exam Uterus: anteverted and non-tender, normal size   Lab Review Urine pregnancy test Labs reviewed yes Radiologic studies reviewed no  50% of 20 min visit spent on counseling and coordination of care.   Assessment:     1. Encounter for annual routine  gynecological examination Rx: - Cytology - PAP - Cervicovaginal ancillary only - TSH - Comprehensive metabolic panel - CBC  2. Encounter for surveillance of contraceptive pills - pleased with OCP's - levonorgestrel-ethinyl estradiol (NORDETTE) 0.15-30 MG-MCG tablet; Take 1 tablet by mouth daily.  Dispense: 28 tablet; Refill: 11  3. Routine adult health maintenance Rx: - Ambulatory referral to Internal Medicine   Plan:    Education reviewed: calcium supplements, depression evaluation, low fat, low cholesterol diet, safe sex/STD prevention, self breast exams and weight bearing exercise. Contraception: OCP (estrogen/progesterone). Follow up in: 1 year.   Meds ordered this encounter  Medications  . levonorgestrel-ethinyl estradiol (NORDETTE) 0.15-30 MG-MCG tablet    Sig: Take 1 tablet by mouth daily.    Dispense:  28 tablet    Refill:  11   Orders Placed This Encounter  Procedures  . TSH  . Comprehensive metabolic panel  . CBC  . Ambulatory referral to Internal Medicine    Referral Priority:   Routine    Referral Type:   Consultation    Referral Reason:   Specialty Services Required    Requested Specialty:   Internal Medicine    Number of Visits Requested:   1

## 2016-12-30 NOTE — Progress Notes (Signed)
Patient presents for her Annual Exam today LMP:12/18/16 Last pap: 12/25/2015 : Normal Declines any STD testing today.  Pt staes she has no concerns today.

## 2016-12-31 LAB — CBC
HEMATOCRIT: 36.5 % (ref 34.0–46.6)
Hemoglobin: 12.7 g/dL (ref 11.1–15.9)
MCH: 30.2 pg (ref 26.6–33.0)
MCHC: 34.8 g/dL (ref 31.5–35.7)
MCV: 87 fL (ref 79–97)
Platelets: 296 10*3/uL (ref 150–379)
RBC: 4.21 x10E6/uL (ref 3.77–5.28)
RDW: 12.9 % (ref 12.3–15.4)
WBC: 5.9 10*3/uL (ref 3.4–10.8)

## 2016-12-31 LAB — CERVICOVAGINAL ANCILLARY ONLY
Bacterial vaginitis: NEGATIVE
Candida vaginitis: NEGATIVE
Chlamydia: NEGATIVE
Neisseria Gonorrhea: NEGATIVE
Trichomonas: NEGATIVE

## 2016-12-31 LAB — COMPREHENSIVE METABOLIC PANEL
A/G RATIO: 1.1 — AB (ref 1.2–2.2)
ALBUMIN: 4.2 g/dL (ref 3.5–5.5)
ALT: 9 IU/L (ref 0–32)
AST: 17 IU/L (ref 0–40)
Alkaline Phosphatase: 59 IU/L (ref 39–117)
BILIRUBIN TOTAL: 0.3 mg/dL (ref 0.0–1.2)
BUN / CREAT RATIO: 27 — AB (ref 9–23)
BUN: 22 mg/dL — AB (ref 6–20)
CHLORIDE: 103 mmol/L (ref 96–106)
CO2: 26 mmol/L (ref 20–29)
Calcium: 9.4 mg/dL (ref 8.7–10.2)
Creatinine, Ser: 0.82 mg/dL (ref 0.57–1.00)
GFR calc non Af Amer: 96 mL/min/{1.73_m2} (ref >59)
GFR, EST AFRICAN AMERICAN: 111 mL/min/{1.73_m2} (ref >59)
GLOBULIN, TOTAL: 4 g/dL (ref 1.5–4.5)
Glucose: 81 mg/dL (ref 65–99)
POTASSIUM: 4.3 mmol/L (ref 3.5–5.2)
Sodium: 142 mmol/L (ref 134–144)
TOTAL PROTEIN: 8.2 g/dL (ref 6.0–8.5)

## 2016-12-31 LAB — TSH: TSH: 0.866 u[IU]/mL (ref 0.450–4.500)

## 2016-12-31 LAB — CYTOLOGY - PAP
Diagnosis: NEGATIVE
HPV: DETECTED — AB

## 2017-01-05 MED FILL — LEVONOR-ETH ESTRAD 0.15-0.0: 0.15-30 | 84 days supply | Qty: 84 | Fill #0

## 2017-01-20 ENCOUNTER — Encounter: Payer: Self-pay | Admitting: *Deleted

## 2017-01-30 MED FILL — DIVALPROEX SOD ER 500 MG TA: 500 | 30 days supply | Qty: 90 | Fill #1

## 2017-02-28 ENCOUNTER — Emergency Department (HOSPITAL_BASED_OUTPATIENT_CLINIC_OR_DEPARTMENT_OTHER)
Admission: EM | Admit: 2017-02-28 | Discharge: 2017-02-28 | Disposition: A | Discharge status: Home / Self Care | Payer: 59 | Attending: Emergency Medicine | Admitting: Emergency Medicine

## 2017-02-28 ENCOUNTER — Emergency Department (HOSPITAL_BASED_OUTPATIENT_CLINIC_OR_DEPARTMENT_OTHER): Payer: 59

## 2017-02-28 ENCOUNTER — Encounter (HOSPITAL_BASED_OUTPATIENT_CLINIC_OR_DEPARTMENT_OTHER): Payer: Self-pay | Admitting: Emergency Medicine

## 2017-02-28 ENCOUNTER — Other Ambulatory Visit: Payer: Self-pay

## 2017-02-28 DIAGNOSIS — R350 Frequency of micturition: Secondary | ICD-10-CM | POA: Insufficient documentation

## 2017-02-28 DIAGNOSIS — K5900 Constipation, unspecified: Secondary | ICD-10-CM | POA: Diagnosis not present

## 2017-02-28 DIAGNOSIS — R3915 Urgency of urination: Secondary | ICD-10-CM | POA: Diagnosis not present

## 2017-02-28 DIAGNOSIS — R1033 Periumbilical pain: Secondary | ICD-10-CM | POA: Insufficient documentation

## 2017-02-28 DIAGNOSIS — Z79899 Other long term (current) drug therapy: Secondary | ICD-10-CM | POA: Diagnosis not present

## 2017-02-28 LAB — URINALYSIS, ROUTINE W REFLEX MICROSCOPIC
BILIRUBIN URINE: NEGATIVE
Glucose, UA: NEGATIVE mg/dL
KETONES UR: NEGATIVE mg/dL
Leukocytes, UA: NEGATIVE
NITRITE: NEGATIVE
Protein, ur: NEGATIVE mg/dL
SPECIFIC GRAVITY, URINE: 1.01 (ref 1.005–1.030)
pH: 6 (ref 5.0–8.0)

## 2017-02-28 LAB — CBC WITH DIFFERENTIAL/PLATELET
Basophils Absolute: 0 K/uL (ref 0.0–0.1)
Basophils Relative: 0 %
Eosinophils Absolute: 0.2 K/uL (ref 0.0–0.7)
Eosinophils Relative: 3 %
HCT: 33.3 % — ABNORMAL LOW (ref 36.0–46.0)
Hemoglobin: 12.1 g/dL (ref 12.0–15.0)
Lymphocytes Relative: 40 %
Lymphs Abs: 2 K/uL (ref 0.7–4.0)
MCH: 30.4 pg (ref 26.0–34.0)
MCHC: 36.3 g/dL — ABNORMAL HIGH (ref 30.0–36.0)
MCV: 83.7 fL (ref 78.0–100.0)
Monocytes Absolute: 0.5 K/uL (ref 0.1–1.0)
Monocytes Relative: 10 %
Neutro Abs: 2.5 K/uL (ref 1.7–7.7)
Neutrophils Relative %: 47 %
Platelets: 246 K/uL (ref 150–400)
RBC: 3.98 MIL/uL (ref 3.87–5.11)
RDW: 12.2 % (ref 11.5–15.5)
WBC: 5.2 K/uL (ref 4.0–10.5)

## 2017-02-28 LAB — COMPREHENSIVE METABOLIC PANEL
ALBUMIN: 3.6 g/dL (ref 3.5–5.0)
ALK PHOS: 34 U/L — AB (ref 38–126)
ALT: 10 U/L — ABNORMAL LOW (ref 14–54)
ANION GAP: 4 — AB (ref 5–15)
AST: 14 U/L — ABNORMAL LOW (ref 15–41)
BILIRUBIN TOTAL: 0.4 mg/dL (ref 0.3–1.2)
BUN: 19 mg/dL (ref 6–20)
CALCIUM: 8.5 mg/dL — AB (ref 8.9–10.3)
CO2: 24 mmol/L (ref 22–32)
Chloride: 107 mmol/L (ref 101–111)
Creatinine, Ser: 0.86 mg/dL (ref 0.44–1.00)
GLUCOSE: 92 mg/dL (ref 65–99)
Potassium: 4 mmol/L (ref 3.5–5.1)
Sodium: 135 mmol/L (ref 135–145)
TOTAL PROTEIN: 7.7 g/dL (ref 6.5–8.1)

## 2017-02-28 LAB — LIPASE, BLOOD: Lipase: 29 U/L (ref 11–51)

## 2017-02-28 LAB — URINALYSIS, MICROSCOPIC (REFLEX)

## 2017-02-28 LAB — PREGNANCY, URINE: PREG TEST UR: NEGATIVE

## 2017-02-28 NOTE — Discharge Instructions (Signed)
Please read and follow all provided instructions.  Your diagnoses today include:  1. Periumbilical pain     Tests performed today include:  Blood counts and electrolytes  Blood tests to check liver and kidney function  Blood tests to check pancreas function  Urine test to look for infection and pregnancy (in women)  X-ray - no signs of severe constipation or bowel blockage  Vital signs. See below for your results today.   Medications prescribed:   None  Take any prescribed medications only as directed.  Home care instructions:   Follow any educational materials contained in this packet.  Follow-up instructions: Please follow-up with your primary care provider in the next 2 days for further evaluation of your symptoms.    Return instructions:  SEEK IMMEDIATE MEDICAL ATTENTION IF:  The pain does not go away or becomes severe   A temperature above 101F develops   Repeated vomiting occurs (multiple episodes)   The pain becomes localized to portions of the abdomen. The right side could possibly be appendicitis. In an adult, the left lower portion of the abdomen could be colitis or diverticulitis.   Blood is being passed in stools or vomit (bright red or black tarry stools)   You develop chest pain, difficulty breathing, dizziness or fainting, or become confused, poorly responsive, or inconsolable (young children)  If you have any other emergent concerns regarding your health  Additional Information: Abdominal (belly) pain can be caused by many things. Your caregiver performed an examination and possibly ordered blood/urine tests and imaging (CT scan, x-rays, ultrasound). Many cases can be observed and treated at home after initial evaluation in the emergency department. Even though you are being discharged home, abdominal pain can be unpredictable. Therefore, you need a repeated exam if your pain does not resolve, returns, or worsens. Most patients with abdominal pain  don't have to be admitted to the hospital or have surgery, but serious problems like appendicitis and gallbladder attacks can start out as nonspecific pain. Many abdominal conditions cannot be diagnosed in one visit, so follow-up evaluations are very important.  Your vital signs today were: BP 134/87 (BP Location: Right Arm)    Pulse 68    Temp 99 F (37.2 C) (Oral)    Resp 16    Ht 5\' 7"  (1.702 m)    Wt 83 kg (183 lb)    LMP 02/26/2017    SpO2 100%    BMI 28.66 kg/m  If your blood pressure (bp) was elevated above 135/85 this visit, please have this repeated by your doctor within one month. --------------

## 2017-02-28 NOTE — ED Triage Notes (Signed)
Centralized abd pain/cramping x 3 days. Denies N/V/D. Pt states she thought she was constipated so she took a laxative, but still no relief.

## 2017-02-28 NOTE — ED Provider Notes (Signed)
[facility] EMERGENCY DEPARTMENT Provider Note   CSN: [id number] Arrival date & time: 02/28/17  4098     History   Chief Complaint Chief Complaint  Patient presents with  . Abdominal Pain    HPI Frances Cole is a 32 y.o. female.  Patient with no past surgical history presents the emergency department with 3 days of acute onset, periumbilical abdominal pain.  Pain is described as sharp.  It is constant.  It was mild at first but has been more severe over the past 2 days.  No associated nausea, vomiting, or diarrhea.  No fevers, cough, or chest pain.  Patient thought that she was constipated because she has only had 2 bowel movements this week so she has had a laxative that did not really change things.  No vaginal bleeding or discharge.  No dysuria, hematuria, increased urgency but she has had some frequency.  No other treatments.  Symptoms not changed with food.        Past Medical History:  Diagnosis Date  . Seizures (HCC)     There are no active problems to display for this patient.   History reviewed. No pertinent surgical history.  OB History    Gravida Para Term Preterm AB Living   0 0 0 0 0 0   SAB TAB Ectopic Multiple Live Births   0 0 0 0 0       Home Medications    Prior to Admission medications   Medication Sig Start Date End Date Taking? Authorizing Provider  divalproex (DEPAKOTE ER) 500 MG 24 hr tablet Take 2 tablets (1,000 mg total) by mouth daily. 04/22/14   Rolan Bucco, MD  levonorgestrel-ethinyl estradiol (NORDETTE) 0.15-30 MG-MCG tablet Take 1 tablet by mouth daily. 12/30/16   Brock Bad, MD  metroNIDAZOLE (FLAGYL) 500 MG tablet Take 1 tablet (500 mg total) by mouth 2 (two) times daily. Patient not taking: Reported on 12/25/2015 12/25/14   Brock Bad, MD    Family History Family History  Problem Relation Age of Onset  . Hypertension Mother   . Lung cancer Maternal Grandfather     Social History Social History     Tobacco Use  . Smoking status: Never Smoker  . Smokeless tobacco: Never Used  Substance Use Topics  . Alcohol use: Yes    Alcohol/week: 0.0 oz    Comment: Occassionally  . Drug use: No     Allergies   Patient has no known allergies.   Review of Systems Review of Systems  Constitutional: Negative for fever.  HENT: Negative for rhinorrhea and sore throat.   Eyes: Negative for redness.  Respiratory: Negative for cough.   Cardiovascular: Negative for chest pain.  Gastrointestinal: Positive for abdominal pain and constipation. Negative for diarrhea, nausea and vomiting.  Genitourinary: Positive for frequency and vaginal bleeding (Currently on expected menstrual period). Negative for dysuria, pelvic pain and vaginal discharge.  Musculoskeletal: Negative for myalgias.  Skin: Negative for rash.  Neurological: Negative for headaches.     Physical Exam Updated Vital Signs BP (!) 132/98 (BP Location: Right Arm)   Pulse 85   Temp 99 F (37.2 C) (Oral)   Resp 16   Ht 5\' 7"  (1.702 m)   Wt 83 kg (183 lb)   LMP 02/26/2017   SpO2 100%   BMI 28.66 kg/m   Physical Exam  Constitutional: She appears well-developed and well-nourished.  HENT:  Head: Normocephalic and atraumatic.  Eyes: Conjunctivae are normal. Right  eye exhibits no discharge. Left eye exhibits no discharge.  Neck: Normal range of motion. Neck supple.  Cardiovascular: Normal rate, regular rhythm and normal heart sounds.  Pulmonary/Chest: Effort normal and breath sounds normal.  Abdominal: Soft. Bowel sounds are normal. There is no hepatosplenomegaly. There is tenderness in the periumbilical area. There is no rebound, no guarding, no tenderness at McBurney's point and negative Murphy's sign.  Neurological: She is alert.  Skin: Skin is warm and dry.  Psychiatric: She has a normal mood and affect.  Nursing note and vitals reviewed.    ED Treatments / Results  Labs (all labs ordered are listed, but only abnormal  results are displayed) Labs Reviewed  URINALYSIS, ROUTINE W REFLEX MICROSCOPIC - Abnormal; Notable for the following components:      Result Value   Color, Urine STRAW (*)    APPearance HAZY (*)    Hgb urine dipstick LARGE (*)    All other components within normal limits  COMPREHENSIVE METABOLIC PANEL - Abnormal; Notable for the following components:   Calcium 8.5 (*)    AST 14 (*)    ALT 10 (*)    Alkaline Phosphatase 34 (*)    Anion gap 4 (*)    All other components within normal limits  CBC WITH DIFFERENTIAL/PLATELET - Abnormal; Notable for the following components:   HCT 33.3 (*)    MCHC 36.3 (*)    All other components within normal limits  URINALYSIS, MICROSCOPIC (REFLEX) - Abnormal; Notable for the following components:   Bacteria, UA MANY (*)    Squamous Epithelial / LPF 6-30 (*)    All other components within normal limits  PREGNANCY, URINE  LIPASE, BLOOD    EKG  EKG Interpretation None       Radiology Dg Abdomen 1 View  Result Date: 02/28/2017 CLINICAL DATA:  Periumbilical pain and constipation. EXAM: ABDOMEN - 1 VIEW COMPARISON:  None FINDINGS: The bowel gas pattern is normal. No radio-opaque calculi or other significant radiographic abnormality are seen. IMPRESSION: Negative. Electronically Signed   By: Signa Kell M.D.   On: 02/28/2017 11:53    Procedures Procedures (including critical care time)  Medications Ordered in ED Medications - No data to display   Initial Impression / Assessment and Plan / ED Course  I have reviewed the triage vital signs and the nursing notes.  Pertinent labs & imaging results that were available during my care of the patient were reviewed by me and considered in my medical decision making (see chart for details).     Patient seen and examined. Work-up initiated.   Vital signs reviewed and are as follows: BP (!) 132/98 (BP Location: Right Arm)   Pulse 85   Temp 99 F (37.2 C) (Oral)   Resp 16   Ht 5\' 7"  (1.702  m)   Wt 83 kg (183 lb)   LMP 02/26/2017   SpO2 100%   BMI 28.66 kg/m   Labs reviewed.  X-ray does not show any signs of obstruction or severe constipation.  Abdominal exam remains unchanged with mild periumbilical tenderness.  We discussed that at this point imaging is not indicated.  We discussed signs and symptoms which should cause her to return and is discussed that abdominal pain can be dynamic.  If she continues to have pain in the next 2 days she would need to be rechecked either here or by her primary care physician.  The patient was urged to return to the Emergency Department immediately with  worsening of current symptoms, worsening abdominal pain, persistent vomiting, blood noted in stools, fever, or any other concerns. The patient verbalized understanding.    Final Clinical Impressions(s) / ED Diagnoses   Final diagnoses:  Periumbilical pain   Patient with 3 days of periumbilical pain, now stable.  No radiation of pain.  No right lower quadrant or right upper quadrant tenderness normal labs.  X-ray without concerning findings.  Given lack of objective findings and lab work, do not feel the patient requires CT imaging today.  She seems reliable to return if her symptoms worsen for further evaluation and consideration of advanced imaging.  She has no focal right upper or right lower quadrant tenderness to suggest problems with her gallbladder or appendix.  She is not pregnant.  No urinary infection symptoms.  ED Discharge Orders    None       Renne CriglerGeiple, Benson Porcaro, Cordelia Poche-C 02/28/17 1642    Eudelia Bunchardama, Amadeo GarnetPedro Eduardo, MD 02/28/17 [phone]

## 2017-03-26 MED FILL — LEVONOR-ETH ESTRAD 0.15-0.0: 0.15-30 | 84 days supply | Qty: 84 | Fill #1

## 2017-05-27 MED FILL — DIVALPROEX SOD ER 500 MG TA: 500 | 30 days supply | Qty: 90 | Fill #2

## 2017-06-19 MED FILL — LEVONOR-ETH ESTRAD 0.15-0.0: 0.15-30 | 84 days supply | Qty: 84 | Fill #2

## 2017-07-10 ENCOUNTER — Ambulatory Visit (INDEPENDENT_AMBULATORY_CARE_PROVIDER_SITE_OTHER): Payer: Self-pay | Admitting: Family Medicine

## 2017-07-10 VITALS — BP 118/82 | HR 76 | Temp 98.9°F | Resp 20 | Ht 66.0 in | Wt 181.6 lb

## 2017-07-10 DIAGNOSIS — Z0000 Encounter for general adult medical examination without abnormal findings: Secondary | ICD-10-CM | POA: Diagnosis not present

## 2017-07-10 DIAGNOSIS — G4089 Other seizures: Secondary | ICD-10-CM | POA: Diagnosis not present

## 2017-07-10 DIAGNOSIS — Z79899 Other long term (current) drug therapy: Secondary | ICD-10-CM | POA: Diagnosis not present

## 2017-07-10 DIAGNOSIS — G441 Vascular headache, not elsewhere classified: Secondary | ICD-10-CM | POA: Diagnosis not present

## 2017-07-10 DIAGNOSIS — E669 Obesity, unspecified: Secondary | ICD-10-CM | POA: Diagnosis not present

## 2017-07-10 NOTE — Patient Instructions (Signed)

## 2017-07-10 NOTE — Progress Notes (Signed)
Frances Cole is a 32 y.o. female who presents today with concerns of need for an annual physical exam for employment. Frances Cole obtained an PCP today and conducted new patient evaluation visit earlier today. She denies any acute of chronic issues she desires to be addressed today.  Review of Systems  Constitutional: Negative for chills, fever and malaise/fatigue.  HENT: Negative for congestion, ear discharge, ear pain, sinus pain and sore throat.   Eyes: Negative.   Respiratory: Negative for cough, sputum production, shortness of breath and wheezing.   Cardiovascular: Negative.  Negative for chest pain.  Gastrointestinal: Negative for abdominal pain, diarrhea, nausea and vomiting.  Genitourinary: Negative for dysuria, frequency, hematuria and urgency.  Musculoskeletal: Negative for myalgias.  Skin: Negative.  Negative for rash.  Neurological: Negative for headaches.  Endo/Heme/Allergies: Negative.   Psychiatric/Behavioral: Negative.     O: Vitals:   07/10/17 1607  BP: 118/82  Pulse: 76  Resp: 20  Temp: 98.9 F (37.2 C)  SpO2: 99%     Physical Exam  Constitutional: She is oriented to person, place, and time. Vital signs are normal. She appears well-developed and well-nourished. She is active.  Non-toxic appearance. She does not have a sickly appearance.  HENT:  Head: Normocephalic.  Right Ear: Hearing, tympanic membrane, external ear and ear canal normal.  Left Ear: Hearing, tympanic membrane, external ear and ear canal normal.  Nose: Nose normal.  Mouth/Throat: Uvula is midline and oropharynx is clear and moist.  Neck: Normal range of motion. Neck supple.  Cardiovascular: Normal rate, regular rhythm, normal heart sounds and normal pulses.  Pulmonary/Chest: Effort normal and breath sounds normal.  Abdominal: Soft. Bowel sounds are normal.  Musculoskeletal: Normal range of motion.  Lymphadenopathy:       Head (right side): No submental and no submandibular adenopathy  present.       Head (left side): No submental and no submandibular adenopathy present.    She has no cervical adenopathy.  Neurological: She is alert and oriented to person, place, and time.  Psychiatric: She has a normal mood and affect. Her speech is normal and behavior is normal. Cognition and memory are normal.  PHQ-9- negative  Vitals reviewed.  A: 1. Physical exam    P: Exam findings, diagnosis etiology and medication use and indications reviewed with patient. Follow- Up and discharge instructions provided. No emergent/urgent issues found on exam.  Patient verbalized understanding of information provided and agrees with plan of care (POC), all questions answered.  1. Physical exam WNL- completed

## 2017-07-28 DIAGNOSIS — G40909 Epilepsy, unspecified, not intractable, without status epilepticus: Secondary | ICD-10-CM | POA: Diagnosis not present

## 2017-08-26 ENCOUNTER — Ambulatory Visit (INDEPENDENT_AMBULATORY_CARE_PROVIDER_SITE_OTHER): Payer: Self-pay | Admitting: Nurse Practitioner

## 2017-08-26 VITALS — BP 120/80 | HR 71 | Temp 98.9°F | Resp 18 | Wt 180.4 lb

## 2017-08-26 DIAGNOSIS — B9789 Other viral agents as the cause of diseases classified elsewhere: Secondary | ICD-10-CM

## 2017-08-26 DIAGNOSIS — J329 Chronic sinusitis, unspecified: Secondary | ICD-10-CM

## 2017-08-26 MED ORDER — FLUTICASONE PROPIONATE 50 MCG/ACT NA SUSP: 2.0000 | Freq: Every day | NASAL | 0 refills | Status: DC

## 2017-08-26 MED ORDER — AMOXICILLIN-POT CLAVULANATE 875-125 MG PO TABS: 1.0000 | ORAL_TABLET | Freq: Two times a day (BID) | ORAL | 0 refills | Status: AC

## 2017-08-26 MED ORDER — MONTELUKAST SODIUM 10 MG PO TABS: 10.0000 mg | ORAL_TABLET | Freq: Every day | ORAL | 0 refills | Status: DC

## 2017-08-26 MED FILL — MONTELUKAST SOD 10 MG TAB: 10 | 30 days supply | Qty: 30 | Fill #0

## 2017-08-26 MED FILL — FLUTICASONE PROP 50 MCG SPR: 50 | 30 days supply | Qty: 16 | Fill #0

## 2017-08-26 NOTE — Progress Notes (Signed)
Subjective:  Frances Cole is a 32 y.o. female who presents for evaluation of possible sinusitis.  Symptoms include headache described as dull, post nasal drip, sinus pressure, sinus pain and sore throat.  Onset of symptoms was 3 days ago, and has been gradually worsening since that time.  Treatment to date:  Theraflu and Zyrtec.  High risk factors for influenza complications:  co-morbid illness and patient has a history of seizures.  The patient's last seizure was 2-3 years ago..  The following portions of the patient's history were reviewed and updated as appropriate:  allergies, current medications and past medical history.  Constitutional: positive for fatigue, negative for anorexia, chills, fevers and sweats Eyes: negative Ears, nose, mouth, throat, and face: positive for nasal congestion and sore throat, negative for ear drainage and earaches Respiratory: negative Cardiovascular: negative Gastrointestinal: negative Neurological: positive for headaches, negative for coordination problems, dizziness, paresthesia, seizures, speech problems, tremors, vertigo and weakness Allergic/Immunologic: positive for hay fever Objective:  BP 120/80 (BP Location: Right Arm, Patient Position: Sitting, Cuff Size: Normal)   Pulse 71   Temp 98.9 F (37.2 C) (Oral)   Resp 18   Wt 180 lb 6.4 oz (81.8 kg)   SpO2 99%   BMI 29.12 kg/m  General appearance: alert, cooperative and no distress Head: Normocephalic, without obvious abnormality, atraumatic Eyes: conjunctivae/corneas clear. PERRL, EOM's intact. Fundi benign. Ears: normal TM's and external ear canals both ears Nose: no discharge, turbinates swollen, inflamed, no sinus tenderness Throat: lips, mucosa, and tongue normal; teeth and gums normal Lungs: clear to auscultation bilaterally Heart: regular rate and rhythm, S1, S2 normal, no murmur, click, rub or gallop Abdomen: soft, non-tender; bowel sounds normal; no masses,  no organomegaly Pulses: 2+  and symmetric Skin: Skin color, texture, turgor normal. No rashes or lesions Lymph nodes: cervical and submandibular nodes normal Neurologic: Grossly normal    Assessment:  sinusitis    Plan:  Exam findings, diagnosis etiology and medication use and indications reviewed with patient. Follow- Up and discharge instructions provided. No emergent/urgent issues found on exam.  Patient verbalized understanding of information provided and agrees with plan of care (POC), all questions answered.  1. Viral sinusitis  - montelukast (SINGULAIR) 10 MG tablet; Take 1 tablet (10 mg total) by mouth at bedtime.  Dispense: 30 tablet; Refill: 0 - fluticasone (FLONASE) 50 MCG/ACT nasal spray; Place 2 sprays into both nostrils daily for 10 days.  Dispense: 16 g; Refill: 0 -Use Singulair and Fluticasone for the next 5-7 days to see if your symptoms improve. -If symptoms worsen, have prescription for Augmentin filled. Call the office before starting this medication. -Warm saltwater gargles for throat pain. -Ibuprofen or Tylenol for pain, fever, or general discomfort. -Follow up as needed.

## 2017-08-26 NOTE — Patient Instructions (Signed)

## 2017-08-31 DIAGNOSIS — L810 Postinflammatory hyperpigmentation: Secondary | ICD-10-CM | POA: Diagnosis not present

## 2017-08-31 DIAGNOSIS — Z79899 Other long term (current) drug therapy: Secondary | ICD-10-CM | POA: Diagnosis not present

## 2017-08-31 DIAGNOSIS — L700 Acne vulgaris: Secondary | ICD-10-CM | POA: Diagnosis not present

## 2017-08-31 MED FILL — ADAPALENE 0.3% GEL: 0.3 | 30 days supply | Qty: 45 | Fill #0

## 2017-08-31 MED FILL — SPIRONOLACTONE 50 MG TABS: 50 | 30 days supply | Qty: 30 | Fill #0

## 2017-09-15 MED FILL — LEVONOR-ETH ESTRAD 0.15-0.0: 0.15-30 | 84 days supply | Qty: 84 | Fill #3

## 2017-09-15 MED FILL — DIVALPROEX SOD ER 500 MG TA: 500 | 30 days supply | Qty: 90 | Fill #3

## 2017-09-28 DIAGNOSIS — L700 Acne vulgaris: Secondary | ICD-10-CM | POA: Diagnosis not present

## 2017-09-28 DIAGNOSIS — Z79899 Other long term (current) drug therapy: Secondary | ICD-10-CM | POA: Diagnosis not present

## 2017-09-29 MED FILL — ACZONE 7.5% GEL PUMP: 7.5 | 30 days supply | Qty: 60 | Fill #0

## 2017-10-07 MED FILL — SPIRONOLACTONE 50 MG TABS: 50 | 30 days supply | Qty: 30 | Fill #0

## 2017-12-07 MED FILL — LEVONOR-ETH ESTRAD 0.15-0.0: 0.15-30 | 28 days supply | Qty: 28 | Fill #0

## 2017-12-07 MED FILL — DIVALPROEX SOD ER 500 MG TA: 500 | 30 days supply | Qty: 90 | Fill #0

## 2018-03-11 MED FILL — DIVALPROEX SOD ER 500 MG TA: 500 | 30 days supply | Qty: 90 | Fill #1

## 2018-06-01 MED FILL — HYDROQUINONE 4 % CREA: 4 | 28 days supply | Qty: 28 | Fill #0

## 2018-06-17 MED FILL — DIVALPROEX SOD ER 500 MG TA: 500 | 30 days supply | Qty: 90 | Fill #2

## 2018-07-29 DIAGNOSIS — Z79899 Other long term (current) drug therapy: Secondary | ICD-10-CM | POA: Diagnosis not present

## 2018-07-29 DIAGNOSIS — G40909 Epilepsy, unspecified, not intractable, without status epilepticus: Secondary | ICD-10-CM | POA: Diagnosis not present

## 2018-07-29 MED FILL — DIVALPROEX SOD ER 500 MG TA: 500 | 30 days supply | Qty: 90 | Fill #0

## 2018-09-17 MED FILL — DIVALPROEX SOD ER 500 MG TA: 500 | 30 days supply | Qty: 90 | Fill #0

## 2018-12-16 MED FILL — DIVALPROEX SOD ER 500 MG TA: 500 | 30 days supply | Qty: 90 | Fill #1

## 2018-12-23 IMAGING — CR DG ABDOMEN 1V
1 series · 1 of 1 positions shown · non-contrast
Comparison: None

CLINICAL DATA: Periumbilical pain and constipation.

EXAM:
ABDOMEN - 1 VIEW

[t abdomen supine]
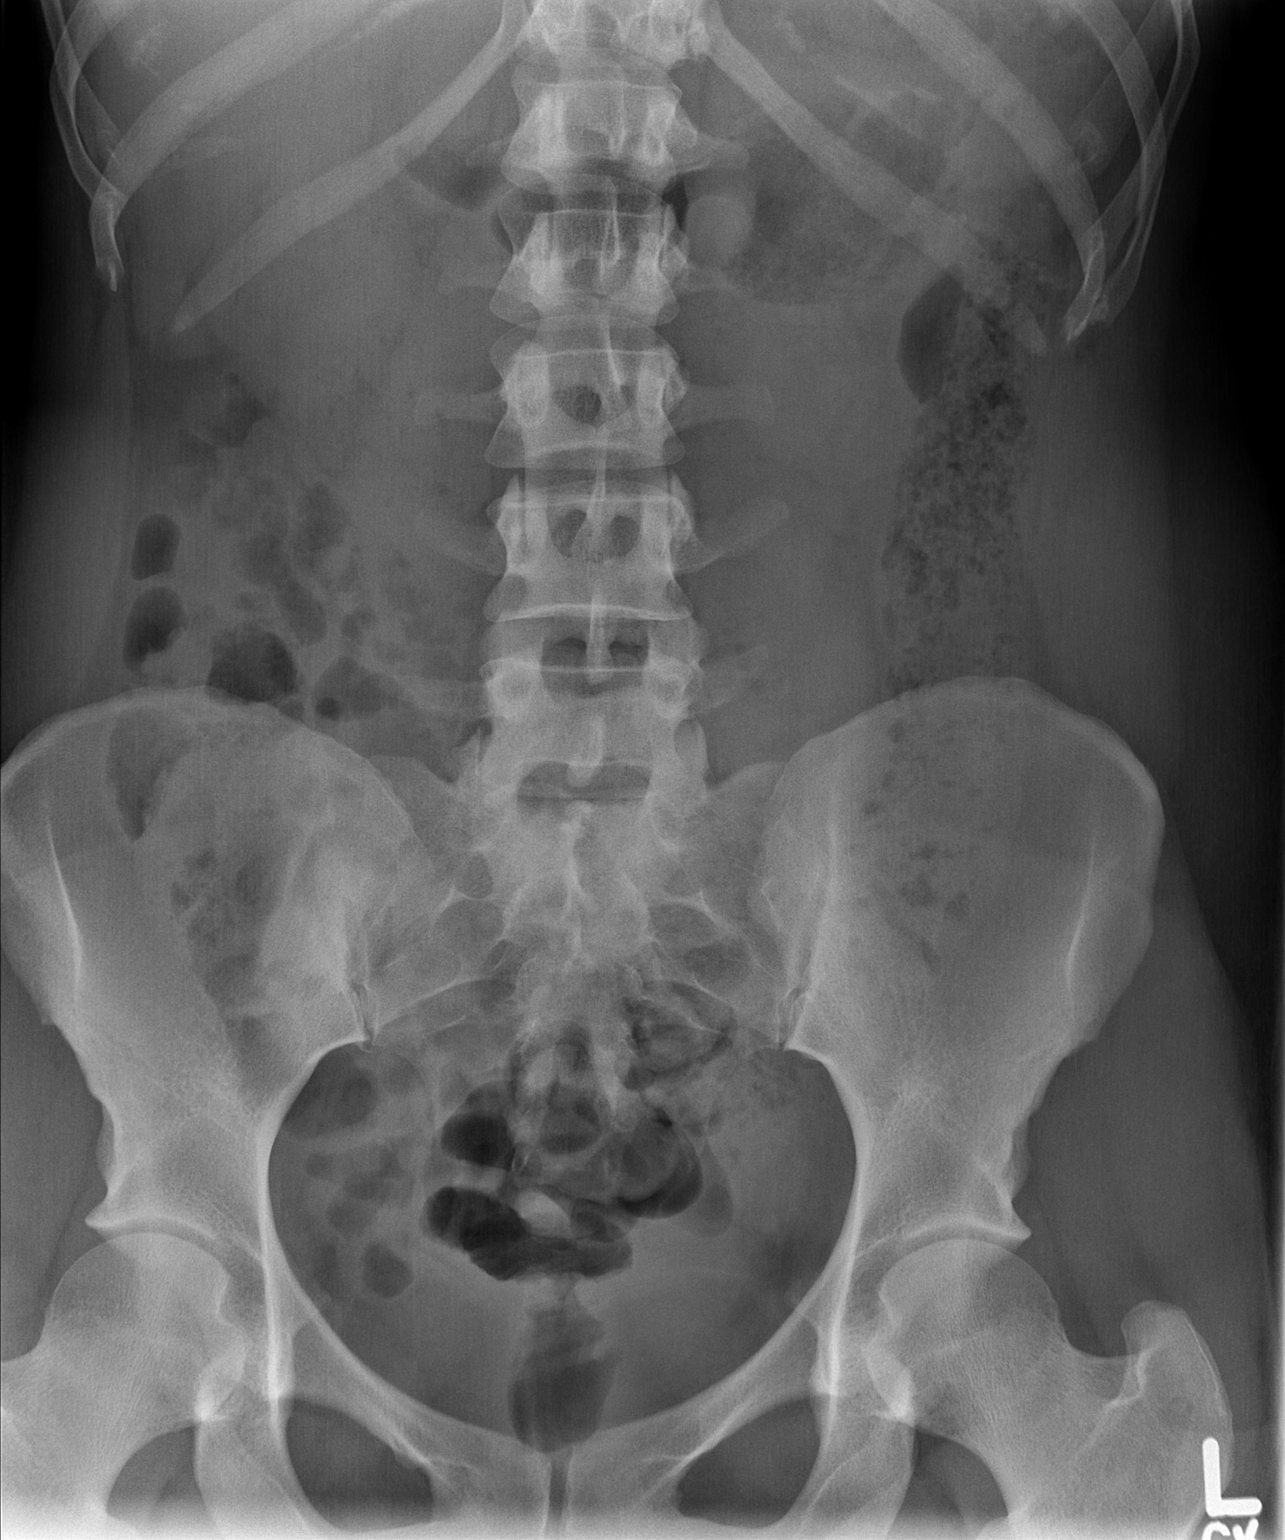

[1 of 1 positions shown; findings below may reference images not displayed]

FINDINGS: The bowel gas pattern is normal. No radio-opaque calculi or other
significant radiographic abnormality are seen.
IMPRESSION: Negative.

## 2019-01-24 MED FILL — DIVALPROEX SOD ER 500 MG TA: 500 | 30 days supply | Qty: 90 | Fill #2

## 2019-04-11 MED FILL — DIVALPROEX SOD ER 500 MG TA: 500 | 30 days supply | Qty: 90 | Fill #3

## 2019-04-14 DIAGNOSIS — R430 Anosmia: Secondary | ICD-10-CM | POA: Diagnosis not present

## 2019-05-04 DIAGNOSIS — U071 COVID-19: Secondary | ICD-10-CM | POA: Diagnosis not present

## 2019-05-04 DIAGNOSIS — Z03818 Encounter for observation for suspected exposure to other biological agents ruled out: Secondary | ICD-10-CM | POA: Diagnosis not present

## 2019-05-25 DIAGNOSIS — Z0000 Encounter for general adult medical examination without abnormal findings: Secondary | ICD-10-CM | POA: Diagnosis not present

## 2019-05-25 DIAGNOSIS — Z131 Encounter for screening for diabetes mellitus: Secondary | ICD-10-CM | POA: Diagnosis not present

## 2019-05-25 DIAGNOSIS — Z13220 Encounter for screening for lipoid disorders: Secondary | ICD-10-CM | POA: Diagnosis not present

## 2019-05-25 DIAGNOSIS — Z7251 High risk heterosexual behavior: Secondary | ICD-10-CM | POA: Diagnosis not present

## 2019-05-25 DIAGNOSIS — R5383 Other fatigue: Secondary | ICD-10-CM | POA: Diagnosis not present

## 2019-05-25 DIAGNOSIS — R0602 Shortness of breath: Secondary | ICD-10-CM | POA: Diagnosis not present

## 2019-05-25 DIAGNOSIS — Z1339 Encounter for screening examination for other mental health and behavioral disorders: Secondary | ICD-10-CM | POA: Diagnosis not present

## 2019-05-25 DIAGNOSIS — Z114 Encounter for screening for human immunodeficiency virus [HIV]: Secondary | ICD-10-CM | POA: Diagnosis not present

## 2019-05-25 DIAGNOSIS — E559 Vitamin D deficiency, unspecified: Secondary | ICD-10-CM | POA: Diagnosis not present

## 2019-05-25 DIAGNOSIS — Z23 Encounter for immunization: Secondary | ICD-10-CM | POA: Diagnosis not present

## 2019-05-25 DIAGNOSIS — Z01419 Encounter for gynecological examination (general) (routine) without abnormal findings: Secondary | ICD-10-CM | POA: Diagnosis not present

## 2019-05-27 DIAGNOSIS — Z111 Encounter for screening for respiratory tuberculosis: Secondary | ICD-10-CM | POA: Diagnosis not present

## 2019-05-27 DIAGNOSIS — E559 Vitamin D deficiency, unspecified: Secondary | ICD-10-CM | POA: Diagnosis not present

## 2019-05-27 DIAGNOSIS — J309 Allergic rhinitis, unspecified: Secondary | ICD-10-CM | POA: Diagnosis not present

## 2019-05-27 DIAGNOSIS — Z7689 Persons encountering health services in other specified circumstances: Secondary | ICD-10-CM | POA: Diagnosis not present

## 2019-05-31 MED FILL — DIVALPROEX SOD ER 500 MG TA: 500 | 30 days supply | Qty: 90 | Fill #4

## 2019-06-03 MED FILL — RETIN-A 0.05% CREAM: 0.05 | 30 days supply | Qty: 45 | Fill #0

## 2019-06-07 DIAGNOSIS — R9431 Abnormal electrocardiogram [ECG] [EKG]: Secondary | ICD-10-CM | POA: Diagnosis not present

## 2019-06-07 DIAGNOSIS — R0602 Shortness of breath: Secondary | ICD-10-CM | POA: Diagnosis not present

## 2019-08-12 MED FILL — DIVALPROEX SOD ER 500 MG TA: 500 | 90 days supply | Qty: 270 | Fill #0

## 2019-08-25 MED FILL — VIT D2 1.25 MG (50,000 UNIT: 1.25 MG | 28 days supply | Qty: 4 | Fill #1

## 2019-10-13 MED FILL — FLUARIX QUADRIVALENT 0.5 ML: 0.5 | 1 days supply | Qty: 1 | Fill #0

## 2019-10-25 ENCOUNTER — Other Ambulatory Visit (HOSPITAL_BASED_OUTPATIENT_CLINIC_OR_DEPARTMENT_OTHER): Payer: Self-pay | Admitting: Internal Medicine

## 2019-10-25 MED FILL — FLUARIX QUADRIVALENT 0.5 ML: 0.5 | 1 days supply | Qty: 1 | Fill #0

## 2020-07-03 ENCOUNTER — Other Ambulatory Visit (HOSPITAL_BASED_OUTPATIENT_CLINIC_OR_DEPARTMENT_OTHER): Payer: Self-pay

## 2020-07-04 ENCOUNTER — Other Ambulatory Visit (HOSPITAL_BASED_OUTPATIENT_CLINIC_OR_DEPARTMENT_OTHER): Payer: Self-pay

## 2020-07-06 ENCOUNTER — Other Ambulatory Visit (HOSPITAL_BASED_OUTPATIENT_CLINIC_OR_DEPARTMENT_OTHER): Payer: Self-pay

## 2020-07-09 ENCOUNTER — Other Ambulatory Visit (HOSPITAL_COMMUNITY): Payer: Self-pay

## 2020-07-09 MED ORDER — DIVALPROEX SODIUM ER 500 MG PO TB24
1500.0000 mg | ORAL_TABLET | Freq: Every day | ORAL | 0 refills | Status: DC
  Filled 2020-07-09 – 2020-07-12 (×2): qty 90, 30d supply, fill #0

## 2020-07-12 ENCOUNTER — Other Ambulatory Visit (HOSPITAL_BASED_OUTPATIENT_CLINIC_OR_DEPARTMENT_OTHER): Payer: Self-pay

## 2020-07-12 ENCOUNTER — Other Ambulatory Visit (HOSPITAL_COMMUNITY): Payer: Self-pay

## 2020-07-12 DIAGNOSIS — R569 Unspecified convulsions: Secondary | ICD-10-CM | POA: Diagnosis not present

## 2020-07-12 MED ORDER — DIVALPROEX SODIUM ER 500 MG PO TB24
1500.0000 mg | ORAL_TABLET | Freq: Every day | ORAL | 5 refills | Status: DC
  Filled 2020-07-12: qty 270, 90d supply, fill #0
  Filled 2020-10-01: qty 264, 88d supply, fill #0
  Filled 2020-10-01: qty 6, 2d supply, fill #0
  Filled 2021-05-16: qty 90, 30d supply, fill #1
  Filled 2021-07-05: qty 90, 30d supply, fill #2

## 2020-10-01 ENCOUNTER — Other Ambulatory Visit (HOSPITAL_BASED_OUTPATIENT_CLINIC_OR_DEPARTMENT_OTHER): Payer: Self-pay

## 2020-10-02 ENCOUNTER — Other Ambulatory Visit (HOSPITAL_BASED_OUTPATIENT_CLINIC_OR_DEPARTMENT_OTHER): Payer: Self-pay

## 2020-10-04 ENCOUNTER — Other Ambulatory Visit (HOSPITAL_BASED_OUTPATIENT_CLINIC_OR_DEPARTMENT_OTHER): Payer: Self-pay

## 2020-10-04 MED ORDER — INFLUENZA VAC SPLIT QUAD 0.5 ML IM SUSY
PREFILLED_SYRINGE | INTRAMUSCULAR | 0 refills | Status: DC
  Filled 2020-10-04: qty 0.5, 1d supply, fill #0

## 2020-11-07 DIAGNOSIS — S01512A Laceration without foreign body of oral cavity, initial encounter: Secondary | ICD-10-CM | POA: Diagnosis not present

## 2020-11-07 DIAGNOSIS — Z3202 Encounter for pregnancy test, result negative: Secondary | ICD-10-CM | POA: Diagnosis not present

## 2020-11-07 DIAGNOSIS — R569 Unspecified convulsions: Secondary | ICD-10-CM | POA: Diagnosis not present

## 2020-11-07 DIAGNOSIS — Z743 Need for continuous supervision: Secondary | ICD-10-CM | POA: Diagnosis not present

## 2020-11-07 DIAGNOSIS — G40909 Epilepsy, unspecified, not intractable, without status epilepticus: Secondary | ICD-10-CM | POA: Diagnosis not present

## 2020-11-07 DIAGNOSIS — Y999 Unspecified external cause status: Secondary | ICD-10-CM | POA: Diagnosis not present

## 2020-12-03 ENCOUNTER — Ambulatory Visit: Payer: 59 | Admitting: Obstetrics

## 2020-12-05 ENCOUNTER — Ambulatory Visit: Payer: 59 | Admitting: Obstetrics

## 2020-12-12 ENCOUNTER — Ambulatory Visit: Payer: 59 | Admitting: Obstetrics

## 2021-01-08 ENCOUNTER — Other Ambulatory Visit (HOSPITAL_BASED_OUTPATIENT_CLINIC_OR_DEPARTMENT_OTHER): Payer: Self-pay

## 2021-01-08 DIAGNOSIS — G40909 Epilepsy, unspecified, not intractable, without status epilepticus: Secondary | ICD-10-CM | POA: Diagnosis not present

## 2021-01-08 DIAGNOSIS — Z79899 Other long term (current) drug therapy: Secondary | ICD-10-CM | POA: Diagnosis not present

## 2021-01-08 MED ORDER — DIVALPROEX SODIUM ER 500 MG PO TB24
1500.0000 mg | ORAL_TABLET | Freq: Every day | ORAL | 5 refills | Status: DC
  Filled 2021-01-08: qty 270, 90d supply, fill #0

## 2021-01-09 ENCOUNTER — Other Ambulatory Visit (HOSPITAL_BASED_OUTPATIENT_CLINIC_OR_DEPARTMENT_OTHER): Payer: Self-pay

## 2021-01-09 DIAGNOSIS — R569 Unspecified convulsions: Secondary | ICD-10-CM | POA: Diagnosis not present

## 2021-01-09 DIAGNOSIS — R110 Nausea: Secondary | ICD-10-CM | POA: Diagnosis not present

## 2021-01-09 DIAGNOSIS — E559 Vitamin D deficiency, unspecified: Secondary | ICD-10-CM | POA: Diagnosis not present

## 2021-01-09 DIAGNOSIS — R0602 Shortness of breath: Secondary | ICD-10-CM | POA: Diagnosis not present

## 2021-01-09 DIAGNOSIS — Z1339 Encounter for screening examination for other mental health and behavioral disorders: Secondary | ICD-10-CM | POA: Diagnosis not present

## 2021-01-09 DIAGNOSIS — Z7251 High risk heterosexual behavior: Secondary | ICD-10-CM | POA: Diagnosis not present

## 2021-01-09 DIAGNOSIS — Z0000 Encounter for general adult medical examination without abnormal findings: Secondary | ICD-10-CM | POA: Diagnosis not present

## 2021-01-09 DIAGNOSIS — Z1331 Encounter for screening for depression: Secondary | ICD-10-CM | POA: Diagnosis not present

## 2021-01-09 DIAGNOSIS — R5383 Other fatigue: Secondary | ICD-10-CM | POA: Diagnosis not present

## 2021-01-11 DIAGNOSIS — Z8669 Personal history of other diseases of the nervous system and sense organs: Secondary | ICD-10-CM | POA: Diagnosis not present

## 2021-01-23 ENCOUNTER — Ambulatory Visit: Payer: 59 | Admitting: Nurse Practitioner

## 2021-03-04 ENCOUNTER — Other Ambulatory Visit: Payer: Self-pay

## 2021-03-04 ENCOUNTER — Encounter: Payer: Self-pay | Admitting: Family Medicine

## 2021-03-04 ENCOUNTER — Ambulatory Visit: Payer: 59 | Admitting: Family Medicine

## 2021-03-04 ENCOUNTER — Other Ambulatory Visit (HOSPITAL_COMMUNITY)
Admission: RE | Admit: 2021-03-04 | Discharge: 2021-03-04 | Disposition: A | Discharge status: Home / Self Care | Payer: 59 | Source: Ambulatory Visit | Attending: Family Medicine | Admitting: Family Medicine

## 2021-03-04 VITALS — BP 131/88 | HR 71 | Ht 67.0 in | Wt 154.0 lb

## 2021-03-04 DIAGNOSIS — Z01419 Encounter for gynecological examination (general) (routine) without abnormal findings: Secondary | ICD-10-CM | POA: Diagnosis not present

## 2021-03-04 DIAGNOSIS — Z124 Encounter for screening for malignant neoplasm of cervix: Secondary | ICD-10-CM | POA: Insufficient documentation

## 2021-03-04 DIAGNOSIS — N907 Vulvar cyst: Secondary | ICD-10-CM | POA: Diagnosis not present

## 2021-03-04 NOTE — Progress Notes (Signed)
Pt has a vaginal bump that has been there for one year. Pt denies pain or drainage. Pt states it has stayed the same size for the last year.

## 2021-03-04 NOTE — Patient Instructions (Signed)
Preventive Care 21-35 Years Old, Female °Preventive care refers to lifestyle choices and visits with your health care provider that can promote health and wellness. Preventive care visits are also called wellness exams. °What can I expect for my preventive care visit? °Counseling °During your preventive care visit, your health care provider may ask about your: °Medical history, including: °Past medical problems. °Family medical history. °Pregnancy history. °Current health, including: °Menstrual cycle. °Method of birth control. °Emotional well-being. °Home life and relationship well-being. °Sexual activity and sexual health. °Lifestyle, including: °Alcohol, nicotine or tobacco, and drug use. °Access to firearms. °Diet, exercise, and sleep habits. °Work and work environment. °Sunscreen use. °Safety issues such as seatbelt and bike helmet use. °Physical exam °Your health care provider may check your: °Height and weight. These may be used to calculate your BMI (body mass index). BMI is a measurement that tells if you are at a healthy weight. °Waist circumference. This measures the distance around your waistline. This measurement also tells if you are at a healthy weight and may help predict your risk of certain diseases, such as type 2 diabetes and high blood pressure. °Heart rate and blood pressure. °Body temperature. °Skin for abnormal spots. °What immunizations do I need? °Vaccines are usually given at various ages, according to a schedule. Your health care provider will recommend vaccines for you based on your age, medical history, and lifestyle or other factors, such as travel or where you work. °What tests do I need? °Screening °Your health care provider may recommend screening tests for certain conditions. This may include: °Pelvic exam and Pap test. °Lipid and cholesterol levels. °Diabetes screening. This is done by checking your blood sugar (glucose) after you have not eaten for a while (fasting). °Hepatitis B  test. °Hepatitis C test. °HIV (human immunodeficiency virus) test. °STI (sexually transmitted infection) testing, if you are at risk. °BRCA-related cancer screening. This may be done if you have a family history of breast, ovarian, tubal, or peritoneal cancers. °Talk with your health care provider about your test results, treatment options, and if necessary, the need for more tests. °Follow these instructions at home: °Eating and drinking ° °Eat a healthy diet that includes fresh fruits and vegetables, whole grains, lean protein, and low-fat dairy products. °Take vitamin and mineral supplements as recommended by your health care provider. °Do not drink alcohol if: °Your health care provider tells you not to drink. °You are pregnant, may be pregnant, or are planning to become pregnant. °If you drink alcohol: °Limit how much you have to 0-1 drink a day. °Know how much alcohol is in your drink. In the [place] one drink equals one 12 oz bottle of beer (355 mL), one 5 oz glass of wine (148 mL), or one 1½ oz glass of hard liquor (44 mL). °Lifestyle °Brush your teeth every morning and night with fluoride toothpaste. Floss one time each day. °Exercise for at least 30 minutes 5 or more days each week. °Do not use any products that contain nicotine or tobacco. These products include cigarettes, chewing tobacco, and vaping devices, such as e-cigarettes. If you need help quitting, ask your health care provider. °Do not use drugs. °If you are sexually active, practice safe sex. Use a condom or other form of protection to prevent STIs. °If you do not wish to become pregnant, use a form of birth control. If you plan to become pregnant, see your health care provider for a prepregnancy visit. °Find healthy ways to manage stress, such as: °Meditation, yoga,   or listening to music. °Journaling. °Talking to a trusted person. °Spending time with friends and family. °Minimize exposure to UV radiation to reduce your risk of skin  cancer. °Safety °Always wear your seat belt while driving or riding in a vehicle. °Do not drive: °If you have been drinking alcohol. Do not ride with someone who has been drinking. °If you have been using any mind-altering substances or drugs. °While texting. °When you are tired or distracted. °Wear a helmet and other protective equipment during sports activities. °If you have firearms in your house, make sure you follow all gun safety procedures. °Seek help if you have been physically or sexually abused. °What's next? °Go to your health care provider once a year for an annual wellness visit. °Ask your health care provider how often you should have your eyes and teeth checked. °Stay up to date on all vaccines. °This information is not intended to replace advice given to you by your health care provider. Make sure you discuss any questions you have with your health care provider. °Document Revised: 06/27/2020 Document Reviewed: 06/27/2020 °Elsevier Patient Education © 2022 [employer] ° °

## 2021-03-04 NOTE — Progress Notes (Signed)
Subjective:     Frances Cole is a 36 y.o. female and is here for a comprehensive physical exam. The patient reports no problems. Haivng regular cycles. Notes a small nodule on vulva. Has not grown, does not drain. Is not irritated.        The following portions of the patient's history were reviewed and updated as appropriate: allergies, current medications, past family history, past medical history, past social history, past surgical history, and problem list.  Review of Systems Pertinent items noted in HPI and remainder of comprehensive ROS otherwise negative.   Objective:    BP 131/88    Pulse 71    Ht 5\' 7"  (1.702 m)    Wt 154 lb (69.9 kg)    LMP 02/25/2021    BMI 24.12 kg/m  Chaperone present for exam  General appearance: alert, cooperative, and appears stated age Head: Normocephalic, without obvious abnormality, atraumatic Neck: no adenopathy, supple, symmetrical, trachea midline, and thyroid not enlarged, symmetric, no tenderness/mass/nodules Lungs: clear to auscultation bilaterally Breasts: normal appearance, no masses or tenderness Heart: regular rate and rhythm, S1, S2 normal, no murmur, click, rub or gallop Abdomen: soft, non-tender; bowel sounds normal; no masses,  no organomegaly Pelvic: cervix normal in appearance, external genitalia normal, no adnexal masses or tenderness, no cervical motion tenderness, uterus normal size, shape, and consistency, vagina normal without discharge, and small inclusion cyst in right perineum Extremities: extremities normal, atraumatic, no cyanosis or edema Pulses: 2+ and symmetric Skin: Skin color, texture, turgor normal. No rashes or lesions Lymph nodes: Cervical, supraclavicular, and axillary nodes normal. Neurologic: Grossly normal    Assessment:    Healthy female exam.      Plan:   Problem List Items Addressed This Visit   None Visit Diagnoses     Screening for malignant neoplasm of cervix    -  Primary   Relevant Orders    Cytology - PAP( [facility])   Encounter for gynecological examination without abnormal finding       Inclusion cyst of vulva       no concerns - can remove if bothsome      Condoms for contraception for now.   See After Visit Summary for Counseling Recommendations

## 2021-03-05 LAB — CYTOLOGY - PAP
Comment: NEGATIVE
Diagnosis: NEGATIVE
High risk HPV: NEGATIVE

## 2021-03-21 ENCOUNTER — Ambulatory Visit: Payer: 59 | Admitting: Obstetrics

## 2021-05-16 ENCOUNTER — Other Ambulatory Visit (HOSPITAL_BASED_OUTPATIENT_CLINIC_OR_DEPARTMENT_OTHER): Payer: Self-pay

## 2021-05-17 ENCOUNTER — Other Ambulatory Visit (HOSPITAL_BASED_OUTPATIENT_CLINIC_OR_DEPARTMENT_OTHER): Payer: Self-pay

## 2021-07-05 ENCOUNTER — Other Ambulatory Visit (HOSPITAL_BASED_OUTPATIENT_CLINIC_OR_DEPARTMENT_OTHER): Payer: Self-pay

## 2021-07-08 ENCOUNTER — Other Ambulatory Visit (HOSPITAL_BASED_OUTPATIENT_CLINIC_OR_DEPARTMENT_OTHER): Payer: Self-pay

## 2021-09-02 ENCOUNTER — Other Ambulatory Visit (HOSPITAL_BASED_OUTPATIENT_CLINIC_OR_DEPARTMENT_OTHER): Payer: Self-pay

## 2021-09-02 MED ORDER — DIVALPROEX SODIUM ER 500 MG PO TB24
1500.0000 mg | ORAL_TABLET | Freq: Every day | ORAL | 0 refills | Status: DC
  Filled 2021-09-02: qty 270, 90d supply, fill #0

## 2021-09-03 ENCOUNTER — Other Ambulatory Visit (HOSPITAL_BASED_OUTPATIENT_CLINIC_OR_DEPARTMENT_OTHER): Payer: Self-pay

## 2021-10-31 ENCOUNTER — Other Ambulatory Visit (HOSPITAL_BASED_OUTPATIENT_CLINIC_OR_DEPARTMENT_OTHER): Payer: Self-pay

## 2021-10-31 DIAGNOSIS — R569 Unspecified convulsions: Secondary | ICD-10-CM | POA: Diagnosis not present

## 2021-10-31 MED ORDER — DIVALPROEX SODIUM ER 500 MG PO TB24
ORAL_TABLET | ORAL | 5 refills | Status: DC
  Filled 2021-10-31: qty 270, 90d supply, fill #0
  Filled 2022-03-25: qty 257, 86d supply, fill #0
  Filled 2022-03-25: qty 270, 90d supply, fill #0
  Filled 2022-03-25: qty 13, 4d supply, fill #0

## 2022-03-25 ENCOUNTER — Other Ambulatory Visit (HOSPITAL_BASED_OUTPATIENT_CLINIC_OR_DEPARTMENT_OTHER): Payer: Self-pay

## 2022-04-03 ENCOUNTER — Other Ambulatory Visit (HOSPITAL_BASED_OUTPATIENT_CLINIC_OR_DEPARTMENT_OTHER): Payer: Self-pay

## 2022-04-03 MED ORDER — TRETINOIN 0.05 % EX CREA
TOPICAL_CREAM | CUTANEOUS | 0 refills | Status: AC
  Filled 2022-04-03: qty 45, 30d supply, fill #0

## 2022-11-18 ENCOUNTER — Other Ambulatory Visit (HOSPITAL_BASED_OUTPATIENT_CLINIC_OR_DEPARTMENT_OTHER): Payer: Self-pay

## 2022-11-20 ENCOUNTER — Other Ambulatory Visit (HOSPITAL_BASED_OUTPATIENT_CLINIC_OR_DEPARTMENT_OTHER): Payer: Self-pay

## 2022-11-21 ENCOUNTER — Other Ambulatory Visit (HOSPITAL_BASED_OUTPATIENT_CLINIC_OR_DEPARTMENT_OTHER): Payer: Self-pay

## 2022-11-21 MED ORDER — DIVALPROEX SODIUM ER 500 MG PO TB24
1500.0000 mg | ORAL_TABLET | Freq: Every day | ORAL | 5 refills | Status: AC
  Filled 2022-11-21: qty 270, 90d supply, fill #0
  Filled 2023-08-24: qty 270, 90d supply, fill #1

## 2023-02-11 ENCOUNTER — Other Ambulatory Visit (HOSPITAL_BASED_OUTPATIENT_CLINIC_OR_DEPARTMENT_OTHER): Payer: Self-pay

## 2023-02-11 DIAGNOSIS — G40909 Epilepsy, unspecified, not intractable, without status epilepticus: Secondary | ICD-10-CM | POA: Diagnosis not present

## 2023-02-11 MED ORDER — DIVALPROEX SODIUM ER 500 MG PO TB24
1500.0000 mg | ORAL_TABLET | Freq: Every day | ORAL | 5 refills | Status: AC
  Filled 2023-02-11: qty 270, 90d supply, fill #0

## 2023-02-16 DIAGNOSIS — G40909 Epilepsy, unspecified, not intractable, without status epilepticus: Secondary | ICD-10-CM | POA: Diagnosis not present

## 2023-02-18 ENCOUNTER — Other Ambulatory Visit (HOSPITAL_BASED_OUTPATIENT_CLINIC_OR_DEPARTMENT_OTHER): Payer: Self-pay

## 2023-02-18 MED ORDER — VITAMIN D3 25 MCG (1000 UNIT) PO TABS
1000.0000 [IU] | ORAL_TABLET | Freq: Every day | ORAL | 3 refills | Status: AC
  Filled 2023-02-18: qty 100, 100d supply, fill #0
  Filled 2023-08-24: qty 100, 100d supply, fill #1

## 2023-08-24 ENCOUNTER — Other Ambulatory Visit: Payer: Self-pay

## 2023-08-24 ENCOUNTER — Other Ambulatory Visit (HOSPITAL_BASED_OUTPATIENT_CLINIC_OR_DEPARTMENT_OTHER): Payer: Self-pay

## 2023-09-04 ENCOUNTER — Other Ambulatory Visit (HOSPITAL_BASED_OUTPATIENT_CLINIC_OR_DEPARTMENT_OTHER): Payer: Self-pay

## 2023-10-01 ENCOUNTER — Other Ambulatory Visit (HOSPITAL_BASED_OUTPATIENT_CLINIC_OR_DEPARTMENT_OTHER): Payer: Self-pay
